# Patient Record
Sex: Female | Born: 1975 | Race: Black or African American | Hispanic: No | Marital: Married | Smoking: Never smoker
Health system: Southern US, Community
[De-identification: ages and names within clinical notes are randomized; demographics above are authoritative.]

## PROBLEM LIST (undated history)

## (undated) DIAGNOSIS — Z8719 Personal history of other diseases of the digestive system: Secondary | ICD-10-CM

## (undated) DIAGNOSIS — D649 Anemia, unspecified: Secondary | ICD-10-CM

## (undated) DIAGNOSIS — G4730 Sleep apnea, unspecified: Secondary | ICD-10-CM

## (undated) DIAGNOSIS — J45909 Unspecified asthma, uncomplicated: Secondary | ICD-10-CM

## (undated) DIAGNOSIS — K219 Gastro-esophageal reflux disease without esophagitis: Secondary | ICD-10-CM

## (undated) DIAGNOSIS — I10 Essential (primary) hypertension: Secondary | ICD-10-CM

## (undated) DIAGNOSIS — D219 Benign neoplasm of connective and other soft tissue, unspecified: Secondary | ICD-10-CM

## (undated) DIAGNOSIS — L709 Acne, unspecified: Secondary | ICD-10-CM

## (undated) HISTORY — DX: Benign neoplasm of connective and other soft tissue, unspecified: D21.9

## (undated) HISTORY — DX: Essential (primary) hypertension: I10

## (undated) HISTORY — PX: WISDOM TOOTH EXTRACTION: SHX21

## (undated) HISTORY — DX: Acne, unspecified: L70.9

## (undated) HISTORY — DX: Morbid (severe) obesity due to excess calories: E66.01

---

## 2003-10-21 ENCOUNTER — Other Ambulatory Visit: Admission: RE | Admit: 2003-10-21 | Discharge: 2003-10-21 | Payer: Self-pay | Admitting: Obstetrics and Gynecology

## 2004-01-29 ENCOUNTER — Encounter: Admission: RE | Admit: 2004-01-29 | Discharge: 2004-01-29 | Payer: Self-pay | Admitting: Family Medicine

## 2004-10-27 ENCOUNTER — Other Ambulatory Visit: Admission: RE | Admit: 2004-10-27 | Discharge: 2004-10-27 | Payer: Self-pay | Admitting: Obstetrics and Gynecology

## 2006-04-27 ENCOUNTER — Other Ambulatory Visit: Admission: RE | Admit: 2006-04-27 | Discharge: 2006-04-27 | Payer: Self-pay | Admitting: Obstetrics and Gynecology

## 2007-11-06 ENCOUNTER — Emergency Department (HOSPITAL_COMMUNITY): Admission: EM | Admit: 2007-11-06 | Discharge: 2007-11-06 | Payer: Self-pay | Admitting: Emergency Medicine

## 2008-01-13 ENCOUNTER — Emergency Department (HOSPITAL_COMMUNITY): Admission: EM | Admit: 2008-01-13 | Discharge: 2008-01-14 | Payer: Self-pay | Admitting: Emergency Medicine

## 2010-10-03 ENCOUNTER — Ambulatory Visit (HOSPITAL_BASED_OUTPATIENT_CLINIC_OR_DEPARTMENT_OTHER)
Admission: RE | Admit: 2010-10-03 | Discharge: 2010-10-03 | Disposition: A | Discharge status: Home / Self Care | Payer: BC Managed Care – PPO | Source: Ambulatory Visit | Attending: Emergency Medicine | Admitting: Emergency Medicine

## 2010-10-03 ENCOUNTER — Other Ambulatory Visit (HOSPITAL_BASED_OUTPATIENT_CLINIC_OR_DEPARTMENT_OTHER): Payer: Self-pay | Admitting: Emergency Medicine

## 2010-10-03 ENCOUNTER — Encounter (HOSPITAL_BASED_OUTPATIENT_CLINIC_OR_DEPARTMENT_OTHER): Payer: Self-pay

## 2010-10-03 DIAGNOSIS — R52 Pain, unspecified: Secondary | ICD-10-CM

## 2010-10-03 DIAGNOSIS — M79609 Pain in unspecified limb: Secondary | ICD-10-CM | POA: Insufficient documentation

## 2013-10-02 ENCOUNTER — Ambulatory Visit: Payer: Self-pay | Admitting: Specialist

## 2013-10-02 LAB — PROTIME-INR
INR: 1
PROTHROMBIN TIME: 12.9 s (ref 11.5–14.7)

## 2013-10-02 LAB — COMPREHENSIVE METABOLIC PANEL
ALK PHOS: 77 U/L
ALT: 23 U/L (ref 12–78)
ANION GAP: 3 — AB (ref 7–16)
Albumin: 3.3 g/dL — ABNORMAL LOW (ref 3.4–5.0)
BILIRUBIN TOTAL: 0.4 mg/dL (ref 0.2–1.0)
BUN: 11 mg/dL (ref 7–18)
CO2: 26 mmol/L (ref 21–32)
CREATININE: 0.81 mg/dL (ref 0.60–1.30)
Calcium, Total: 9 mg/dL (ref 8.5–10.1)
Chloride: 105 mmol/L (ref 98–107)
EGFR (Non-African Amer.): >60
GLUCOSE: 84 mg/dL (ref 65–99)
OSMOLALITY: 267 (ref 275–301)
Potassium: 3.7 mmol/L (ref 3.5–5.1)
SGOT(AST): 20 U/L (ref 15–37)
Sodium: 134 mmol/L — ABNORMAL LOW (ref 136–145)
TOTAL PROTEIN: 8 g/dL (ref 6.4–8.2)

## 2013-10-02 LAB — CBC WITH DIFFERENTIAL/PLATELET
Basophil #: 0.1 10*3/uL (ref 0.0–0.1)
Basophil %: 1.1 %
EOS ABS: 0.2 10*3/uL (ref 0.0–0.7)
EOS PCT: 3.9 %
HCT: 38.4 % (ref 35.0–47.0)
HGB: 12.7 g/dL (ref 12.0–16.0)
LYMPHS ABS: 1.9 10*3/uL (ref 1.0–3.6)
LYMPHS PCT: 32.4 %
MCH: 28.4 pg (ref 26.0–34.0)
MCHC: 33 g/dL (ref 32.0–36.0)
MCV: 86 fL (ref 80–100)
MONOS PCT: 6.6 %
Monocyte #: 0.4 x10 3/mm (ref 0.2–0.9)
NEUTROS PCT: 56 %
Neutrophil #: 3.3 10*3/uL (ref 1.4–6.5)
PLATELETS: 219 10*3/uL (ref 150–440)
RBC: 4.45 10*6/uL (ref 3.80–5.20)
RDW: 14.5 % (ref 11.5–14.5)
WBC: 5.8 10*3/uL (ref 3.6–11.0)

## 2013-10-02 LAB — FOLATE: FOLIC ACID: 15.9 ng/mL (ref 3.1–100.0)

## 2013-10-02 LAB — IRON AND TIBC
Iron Bind.Cap.(Total): 425 ug/dL (ref 250–450)
Iron Saturation: 15 %
Iron: 62 ug/dL (ref 50–170)
UNBOUND IRON-BIND. CAP.: 363 ug/dL

## 2013-10-02 LAB — TSH: Thyroid Stimulating Horm: 1.54 u[IU]/mL

## 2013-10-02 LAB — MAGNESIUM: Magnesium: 1.7 mg/dL — ABNORMAL LOW

## 2013-10-02 LAB — AMYLASE: Amylase: 73 U/L (ref 25–115)

## 2013-10-02 LAB — HEMOGLOBIN A1C: HEMOGLOBIN A1C: 5.8 % (ref 4.2–6.3)

## 2013-10-02 LAB — FERRITIN: Ferritin (ARMC): 7 ng/mL — ABNORMAL LOW (ref 8–388)

## 2013-10-02 LAB — APTT: ACTIVATED PTT: 24.7 s (ref 23.6–35.9)

## 2013-10-02 LAB — BILIRUBIN, DIRECT: Bilirubin, Direct: 0.1 mg/dL (ref 0.00–0.20)

## 2013-10-02 LAB — PHOSPHORUS: PHOSPHORUS: 2.4 mg/dL — AB (ref 2.5–4.9)

## 2013-10-02 LAB — LIPASE, BLOOD: LIPASE: 66 U/L — AB (ref 73–393)

## 2013-10-07 ENCOUNTER — Other Ambulatory Visit (HOSPITAL_COMMUNITY): Payer: Self-pay | Admitting: Family Medicine

## 2013-10-07 DIAGNOSIS — R0602 Shortness of breath: Secondary | ICD-10-CM

## 2013-10-10 ENCOUNTER — Encounter (HOSPITAL_COMMUNITY): Payer: BC Managed Care – PPO

## 2013-10-16 ENCOUNTER — Ambulatory Visit: Payer: Self-pay | Admitting: Specialist

## 2013-10-24 ENCOUNTER — Ambulatory Visit: Payer: Self-pay | Admitting: Specialist

## 2013-10-26 ENCOUNTER — Ambulatory Visit: Payer: Self-pay | Admitting: Specialist

## 2014-08-18 ENCOUNTER — Ambulatory Visit: Payer: Self-pay | Admitting: Specialist

## 2014-08-26 ENCOUNTER — Ambulatory Visit
Admission: RE | Admit: 2014-08-26 | Discharge: 2014-08-26 | Disposition: A | Discharge status: Home / Self Care | Payer: BC Managed Care – PPO | Source: Ambulatory Visit | Attending: Physician Assistant | Admitting: Physician Assistant

## 2014-08-26 ENCOUNTER — Other Ambulatory Visit: Payer: Self-pay | Admitting: Physician Assistant

## 2014-08-26 DIAGNOSIS — R059 Cough, unspecified: Secondary | ICD-10-CM

## 2014-08-26 DIAGNOSIS — R05 Cough: Secondary | ICD-10-CM

## 2015-02-26 ENCOUNTER — Other Ambulatory Visit: Payer: Self-pay

## 2015-02-26 ENCOUNTER — Encounter (HOSPITAL_COMMUNITY): Payer: Self-pay

## 2015-02-26 ENCOUNTER — Encounter (HOSPITAL_COMMUNITY)
Admission: RE | Admit: 2015-02-26 | Discharge: 2015-02-26 | Disposition: A | Discharge status: Home / Self Care | Payer: BLUE CROSS/BLUE SHIELD | Source: Ambulatory Visit | Attending: Obstetrics and Gynecology | Admitting: Obstetrics and Gynecology

## 2015-02-26 DIAGNOSIS — Z01818 Encounter for other preprocedural examination: Secondary | ICD-10-CM | POA: Insufficient documentation

## 2015-02-26 HISTORY — DX: Anemia, unspecified: D64.9

## 2015-02-26 HISTORY — DX: Sleep apnea, unspecified: G47.30

## 2015-02-26 HISTORY — DX: Unspecified asthma, uncomplicated: J45.909

## 2015-02-26 HISTORY — DX: Gastro-esophageal reflux disease without esophagitis: K21.9

## 2015-02-26 HISTORY — DX: Personal history of other diseases of the digestive system: Z87.19

## 2015-02-26 LAB — BASIC METABOLIC PANEL
ANION GAP: 6 (ref 5–15)
BUN: 14 mg/dL (ref 6–20)
CALCIUM: 9.1 mg/dL (ref 8.9–10.3)
CO2: 26 mmol/L (ref 22–32)
CREATININE: 0.89 mg/dL (ref 0.44–1.00)
Chloride: 103 mmol/L (ref 101–111)
GFR calc Af Amer: >60 mL/min (ref >60)
Glucose, Bld: 118 mg/dL — ABNORMAL HIGH (ref 65–99)
POTASSIUM: 3.2 mmol/L — AB (ref 3.5–5.1)
SODIUM: 135 mmol/L (ref 135–145)

## 2015-02-26 LAB — CBC
HCT: 36.8 % (ref 36.0–46.0)
Hemoglobin: 12.1 g/dL (ref 12.0–15.0)
MCH: 27.6 pg (ref 26.0–34.0)
MCHC: 32.9 g/dL (ref 30.0–36.0)
MCV: 83.8 fL (ref 78.0–100.0)
Platelets: 237 10*3/uL (ref 150–400)
RBC: 4.39 MIL/uL (ref 3.87–5.11)
RDW: 15.1 % (ref 11.5–15.5)
WBC: 5.9 10*3/uL (ref 4.0–10.5)

## 2015-02-26 NOTE — Patient Instructions (Signed)
Your procedure is scheduled on:03/03/15  Enter through the Main Entrance at :6am Pick up desk phone and dial [phone] and inform us of your arrival.  Please call [phone] if you have any problems the morning of surgery.  Remember: Do not eat food or drink liquids, including water, after midnight:Tuesday    You may brush your teeth the morning of surgery.  Take these meds the morning of surgery with a sip of water:Blood pressure pills  DO NOT wear jewelry, eye make-up, lipstick,body lotion, or dark fingernail polish.  (Polished toes are ok) You may wear deodorant.  If you are to be admitted after surgery, leave suitcase in car until your room has been assigned. Patients discharged on the day of surgery will not be allowed to drive home. Wear loose fitting, comfortable clothes for your ride home.

## 2015-03-02 MED ORDER — DEXTROSE 5 % IV SOLN
3.0000 g | INTRAVENOUS | Status: AC
  Administered 2015-03-03: 3 g via INTRAVENOUS
  Filled 2015-03-02: qty 3000

## 2015-03-02 NOTE — Anesthesia Preprocedure Evaluation (Addendum)
Anesthesia Evaluation  Patient identified by MRN, date of birth, ID band Patient awake    Reviewed: Allergy & Precautions, NPO status , Patient's Chart, lab work & pertinent test results  History of Anesthesia Complications Negative for: history of anesthetic complications  Airway Mallampati: I  TM Distance: >3 FB Neck ROM: Full    Dental no notable dental hx. (+) Dental Advisory Given, Chipped,    Pulmonary asthma , sleep apnea and Continuous Positive Airway Pressure Ventilation ,  breath sounds clear to auscultation  Pulmonary exam normal       Cardiovascular hypertension, Pt. on medications Normal cardiovascular examRhythm:Regular Rate:Normal     Neuro/Psych negative neurological ROS  negative psych ROS   GI/Hepatic Neg liver ROS, hiatal hernia, GERD-  Medicated and Controlled,  Endo/Other  Morbid obesity  Renal/GU negative Renal ROS  negative genitourinary   Musculoskeletal negative musculoskeletal ROS (+)   Abdominal (+) + obese,   Peds negative pediatric ROS (+)  Hematology  (+) anemia ,   Anesthesia Other Findings   Reproductive/Obstetrics Symptomatic myomas                            Anesthesia Physical Anesthesia Plan  ASA: III  Anesthesia Plan: General   Post-op Pain Management:    Induction: Intravenous  Airway Management Planned: Oral ETT  Additional Equipment:   Intra-op Plan:   Post-operative Plan: Extubation in OR  Informed Consent: I have reviewed the patients History and Physical, chart, labs and discussed the procedure including the risks, benefits and alternatives for the proposed anesthesia with the patient or authorized representative who has indicated his/her understanding and acceptance.   Dental advisory given  Plan Discussed with: CRNA, Anesthesiologist and Surgeon  Anesthesia Plan Comments:        Anesthesia Quick Evaluation

## 2015-03-02 NOTE — H&P (Signed)
Sydney Hamilton is an 39 y.o. female. She has known fibroids, symptoms had been well controlled with OCP.  She was also holding out hope of getting pregnant.  However, she has begun to have more irregular bleeding despite OCP and more pelvic/abdominal bloating, and she now wishes to undergo definitive surgical therapy.  Pertinent Gynecological History: Last mammogram: normal Date: 09/2014 Last pap: normal Date: 2013 OB History: G1, P0010   Menstrual History: No LMP recorded.    Past Medical History  Diagnosis Date  . Hypertension   . Fibroids     Dr. Willis Modena  . Acne     Dr. Allyson Sabal  . Morbid obesity   . Asthma     allergy induced  . Sleep apnea     uses CPAP  . GERD (gastroesophageal reflux disease)   . Anemia     history of due to fibroids  . History of hiatal hernia     Past Surgical History  Procedure Laterality Date  . Wisdom tooth extraction      No family history on file.  Social History:  reports that she has never smoked. She does not have any smokeless tobacco history on file. She reports that she does not drink alcohol or use illicit drugs.  Allergies:  Allergies  Allergen Reactions  . Lisinopril Swelling    Lips and face swell    No prescriptions prior to admission    Review of Systems  Respiratory: Negative.   Cardiovascular: Negative.   Gastrointestinal: Negative.   Genitourinary: Negative.     There were no vitals taken for this visit. Physical Exam  Constitutional: She appears well-developed and well-nourished.  Neck: Neck supple. No thyromegaly present.  Cardiovascular: Normal rate, regular rhythm and normal heart sounds.   No murmur heard. Respiratory: Effort normal and breath sounds normal. No respiratory distress. She has no wheezes.  GI: Soft. She exhibits no distension and no mass. There is no tenderness.  Genitourinary: Vagina normal.  Uterus 8-10 weeks, irregular No adnexal mass    No results found for this or any previous visit  (from the past 24 hour(s)).  No results found.  Assessment/Plan: Known history of myomas, now becoming symptomatic with irregular bleeding even on OCP.  All medical and surgical options have been discussed, she wishes to proceed with definitive surgical therapy.  Surgical procedure, risks, chances of relieving symptoms have all been discussed.  Will admit for TLH, bilateral salpingectomy and vaginal closure of cuff.  Ishan Sanroman D 03/02/2015, 9:00 PM

## 2015-03-03 ENCOUNTER — Encounter (HOSPITAL_COMMUNITY)
Admission: RE | Disposition: A | Discharge status: Home / Self Care | Payer: Self-pay | Source: Ambulatory Visit | Attending: Obstetrics and Gynecology

## 2015-03-03 ENCOUNTER — Ambulatory Visit (HOSPITAL_COMMUNITY): Payer: BLUE CROSS/BLUE SHIELD | Admitting: Anesthesiology

## 2015-03-03 ENCOUNTER — Ambulatory Visit (HOSPITAL_COMMUNITY)
Admission: RE | Admit: 2015-03-03 | Discharge: 2015-03-04 | Disposition: A | Discharge status: Home / Self Care | Payer: BLUE CROSS/BLUE SHIELD | Source: Ambulatory Visit | Attending: Obstetrics and Gynecology | Admitting: Obstetrics and Gynecology

## 2015-03-03 ENCOUNTER — Encounter (HOSPITAL_COMMUNITY): Payer: Self-pay | Admitting: *Deleted

## 2015-03-03 DIAGNOSIS — I10 Essential (primary) hypertension: Secondary | ICD-10-CM | POA: Insufficient documentation

## 2015-03-03 DIAGNOSIS — D259 Leiomyoma of uterus, unspecified: Secondary | ICD-10-CM | POA: Diagnosis present

## 2015-03-03 DIAGNOSIS — G4730 Sleep apnea, unspecified: Secondary | ICD-10-CM | POA: Insufficient documentation

## 2015-03-03 DIAGNOSIS — J45909 Unspecified asthma, uncomplicated: Secondary | ICD-10-CM | POA: Diagnosis not present

## 2015-03-03 DIAGNOSIS — K219 Gastro-esophageal reflux disease without esophagitis: Secondary | ICD-10-CM | POA: Diagnosis not present

## 2015-03-03 DIAGNOSIS — Z888 Allergy status to other drugs, medicaments and biological substances status: Secondary | ICD-10-CM | POA: Insufficient documentation

## 2015-03-03 DIAGNOSIS — D649 Anemia, unspecified: Secondary | ICD-10-CM | POA: Insufficient documentation

## 2015-03-03 DIAGNOSIS — Z90721 Acquired absence of ovaries, unilateral: Secondary | ICD-10-CM

## 2015-03-03 DIAGNOSIS — N838 Other noninflammatory disorders of ovary, fallopian tube and broad ligament: Secondary | ICD-10-CM | POA: Diagnosis not present

## 2015-03-03 DIAGNOSIS — Z90710 Acquired absence of both cervix and uterus: Secondary | ICD-10-CM | POA: Diagnosis present

## 2015-03-03 DIAGNOSIS — K449 Diaphragmatic hernia without obstruction or gangrene: Secondary | ICD-10-CM | POA: Insufficient documentation

## 2015-03-03 HISTORY — PX: REPAIR VAGINAL CUFF: SHX6067

## 2015-03-03 HISTORY — PX: LAPAROSCOPIC HYSTERECTOMY: SHX1926

## 2015-03-03 LAB — GLUCOSE, CAPILLARY: GLUCOSE-CAPILLARY: 140 mg/dL — AB (ref 65–99)

## 2015-03-03 LAB — PREGNANCY, URINE: PREG TEST UR: NEGATIVE

## 2015-03-03 SURGERY — HYSTERECTOMY, TOTAL, LAPAROSCOPIC
Anesthesia: General | Site: Vagina

## 2015-03-03 MED ORDER — FENTANYL CITRATE (PF) 100 MCG/2ML IJ SOLN
INTRAMUSCULAR | Status: AC
  Administered 2015-03-03: 25 ug via INTRAVENOUS
  Filled 2015-03-03: qty 2

## 2015-03-03 MED ORDER — GLYCOPYRROLATE 0.2 MG/ML IJ SOLN
INTRAMUSCULAR | Status: DC | PRN
  Administered 2015-03-03: 0.6 mg via INTRAVENOUS

## 2015-03-03 MED ORDER — MENTHOL 3 MG MT LOZG
1.0000 | LOZENGE | OROMUCOSAL | Status: DC | PRN
  Filled 2015-03-03: qty 9

## 2015-03-03 MED ORDER — SODIUM CHLORIDE 0.9 % IJ SOLN
INTRAMUSCULAR | Status: AC
  Filled 2015-03-03: qty 10

## 2015-03-03 MED ORDER — MIDAZOLAM HCL 2 MG/2ML IJ SOLN
INTRAMUSCULAR | Status: DC | PRN
  Administered 2015-03-03: 2 mg via INTRAVENOUS

## 2015-03-03 MED ORDER — KETOROLAC TROMETHAMINE 30 MG/ML IJ SOLN
INTRAMUSCULAR | Status: DC | PRN
  Administered 2015-03-03: 30 mg via INTRAVENOUS

## 2015-03-03 MED ORDER — IBUPROFEN 600 MG PO TABS
600.0000 mg | ORAL_TABLET | Freq: Four times a day (QID) | ORAL | Status: DC | PRN
  Administered 2015-03-04: 600 mg via ORAL
  Filled 2015-03-03: qty 1

## 2015-03-03 MED ORDER — PROPOFOL 10 MG/ML IV BOLUS
INTRAVENOUS | Status: DC | PRN
  Administered 2015-03-03: 40 mg via INTRAVENOUS
  Administered 2015-03-03: 160 mg via INTRAVENOUS

## 2015-03-03 MED ORDER — ONDANSETRON HCL 4 MG/2ML IJ SOLN: 4.0000 mg | Freq: Once | INTRAMUSCULAR | Status: DC | PRN

## 2015-03-03 MED ORDER — FENTANYL CITRATE (PF) 100 MCG/2ML IJ SOLN
25.0000 ug | INTRAMUSCULAR | Status: DC | PRN
  Administered 2015-03-03: 25 ug via INTRAVENOUS

## 2015-03-03 MED ORDER — BUPIVACAINE HCL (PF) 0.25 % IJ SOLN
INTRAMUSCULAR | Status: AC
Start: 2015-03-03 — End: 2015-03-03
  Filled 2015-03-03: qty 30

## 2015-03-03 MED ORDER — PNEUMOCOCCAL VAC POLYVALENT 25 MCG/0.5ML IJ INJ
0.5000 mL | INJECTION | INTRAMUSCULAR | Status: DC
  Filled 2015-03-03: qty 0.5

## 2015-03-03 MED ORDER — LACTATED RINGERS IR SOLN
Status: DC | PRN
  Administered 2015-03-03: 3000 mL

## 2015-03-03 MED ORDER — HYDROCHLOROTHIAZIDE 25 MG PO TABS: 25.0000 mg | ORAL_TABLET | Freq: Every day | ORAL | Status: DC

## 2015-03-03 MED ORDER — DEXAMETHASONE SODIUM PHOSPHATE 4 MG/ML IJ SOLN
INTRAMUSCULAR | Status: DC | PRN
  Administered 2015-03-03: 4 mg via INTRAVENOUS

## 2015-03-03 MED ORDER — AMLODIPINE BESYLATE 5 MG PO TABS
5.0000 mg | ORAL_TABLET | Freq: Every day | ORAL | Status: DC
  Filled 2015-03-03: qty 1

## 2015-03-03 MED ORDER — HYDROMORPHONE HCL 1 MG/ML IJ SOLN: 1.0000 mg | INTRAMUSCULAR | Status: DC | PRN

## 2015-03-03 MED ORDER — MIDAZOLAM HCL 2 MG/2ML IJ SOLN
INTRAMUSCULAR | Status: AC
  Filled 2015-03-03: qty 2

## 2015-03-03 MED ORDER — OXYCODONE-ACETAMINOPHEN 5-325 MG PO TABS
1.0000 | ORAL_TABLET | ORAL | Status: DC | PRN
  Administered 2015-03-03 – 2015-03-04 (×3): 2 via ORAL
  Filled 2015-03-03 (×3): qty 2

## 2015-03-03 MED ORDER — FENTANYL CITRATE (PF) 100 MCG/2ML IJ SOLN
INTRAMUSCULAR | Status: AC
  Filled 2015-03-03: qty 2

## 2015-03-03 MED ORDER — ALUM & MAG HYDROXIDE-SIMETH 200-200-20 MG/5ML PO SUSP: 30.0000 mL | ORAL | Status: DC | PRN

## 2015-03-03 MED ORDER — LIDOCAINE HCL (CARDIAC) 20 MG/ML IV SOLN
INTRAVENOUS | Status: AC
  Filled 2015-03-03: qty 5

## 2015-03-03 MED ORDER — HYDROMORPHONE HCL 1 MG/ML IJ SOLN
INTRAMUSCULAR | Status: DC | PRN
  Administered 2015-03-03 (×2): 0.5 mg via INTRAVENOUS

## 2015-03-03 MED ORDER — FENTANYL CITRATE (PF) 100 MCG/2ML IJ SOLN
INTRAMUSCULAR | Status: DC | PRN
  Administered 2015-03-03: 50 ug via INTRAVENOUS
  Administered 2015-03-03 (×2): 100 ug via INTRAVENOUS
  Administered 2015-03-03 (×2): 50 ug via INTRAVENOUS

## 2015-03-03 MED ORDER — KETOROLAC TROMETHAMINE 30 MG/ML IJ SOLN: 30.0000 mg | Freq: Once | INTRAMUSCULAR | Status: DC

## 2015-03-03 MED ORDER — ROCURONIUM BROMIDE 100 MG/10ML IV SOLN
INTRAVENOUS | Status: DC | PRN
  Administered 2015-03-03: 20 mg via INTRAVENOUS
  Administered 2015-03-03 (×2): 10 mg via INTRAVENOUS
  Administered 2015-03-03: 50 mg via INTRAVENOUS

## 2015-03-03 MED ORDER — LACTATED RINGERS IV SOLN
INTRAVENOUS | Status: DC
  Administered 2015-03-03 (×4): via INTRAVENOUS

## 2015-03-03 MED ORDER — DEXAMETHASONE SODIUM PHOSPHATE 4 MG/ML IJ SOLN
INTRAMUSCULAR | Status: AC
  Filled 2015-03-03: qty 1

## 2015-03-03 MED ORDER — ROCURONIUM BROMIDE 100 MG/10ML IV SOLN
INTRAVENOUS | Status: AC
  Filled 2015-03-03: qty 1

## 2015-03-03 MED ORDER — NEOSTIGMINE METHYLSULFATE 10 MG/10ML IV SOLN
INTRAVENOUS | Status: AC
  Filled 2015-03-03: qty 1

## 2015-03-03 MED ORDER — SCOPOLAMINE 1 MG/3DAYS TD PT72
MEDICATED_PATCH | TRANSDERMAL | Status: AC
  Administered 2015-03-03: 1.5 mg via TRANSDERMAL
  Filled 2015-03-03: qty 1

## 2015-03-03 MED ORDER — DEXTROSE-NACL 5-0.45 % IV SOLN
INTRAVENOUS | Status: DC
  Administered 2015-03-03 – 2015-03-04 (×2): via INTRAVENOUS

## 2015-03-03 MED ORDER — GLYCOPYRROLATE 0.2 MG/ML IJ SOLN
INTRAMUSCULAR | Status: AC
  Filled 2015-03-03: qty 3

## 2015-03-03 MED ORDER — PROPOFOL 10 MG/ML IV BOLUS
INTRAVENOUS | Status: AC
  Filled 2015-03-03: qty 20

## 2015-03-03 MED ORDER — SIMETHICONE 80 MG PO CHEW: 80.0000 mg | CHEWABLE_TABLET | Freq: Four times a day (QID) | ORAL | Status: DC | PRN

## 2015-03-03 MED ORDER — ALBUTEROL SULFATE (2.5 MG/3ML) 0.083% IN NEBU: 3.0000 mL | INHALATION_SOLUTION | Freq: Four times a day (QID) | RESPIRATORY_TRACT | Status: DC | PRN

## 2015-03-03 MED ORDER — BUPIVACAINE HCL (PF) 0.25 % IJ SOLN
INTRAMUSCULAR | Status: AC
  Filled 2015-03-03: qty 30

## 2015-03-03 MED ORDER — NEOSTIGMINE METHYLSULFATE 10 MG/10ML IV SOLN
INTRAVENOUS | Status: DC | PRN
  Administered 2015-03-03: 4 mg via INTRAVENOUS

## 2015-03-03 MED ORDER — LIDOCAINE HCL (CARDIAC) 20 MG/ML IV SOLN
INTRAVENOUS | Status: DC | PRN
  Administered 2015-03-03: 80 mg via INTRAVENOUS

## 2015-03-03 MED ORDER — HYDROMORPHONE HCL 1 MG/ML IJ SOLN
INTRAMUSCULAR | Status: AC
  Filled 2015-03-03: qty 1

## 2015-03-03 MED ORDER — BUPIVACAINE HCL (PF) 0.25 % IJ SOLN
INTRAMUSCULAR | Status: DC | PRN
  Administered 2015-03-03 (×2): 4 mL

## 2015-03-03 MED ORDER — KETOROLAC TROMETHAMINE 30 MG/ML IJ SOLN
INTRAMUSCULAR | Status: AC
  Filled 2015-03-03: qty 1

## 2015-03-03 MED ORDER — ONDANSETRON HCL 4 MG/2ML IJ SOLN: 4.0000 mg | Freq: Four times a day (QID) | INTRAMUSCULAR | Status: DC | PRN

## 2015-03-03 MED ORDER — FENTANYL CITRATE (PF) 250 MCG/5ML IJ SOLN
INTRAMUSCULAR | Status: AC
  Filled 2015-03-03: qty 5

## 2015-03-03 MED ORDER — ONDANSETRON HCL 4 MG/2ML IJ SOLN
INTRAMUSCULAR | Status: AC
  Filled 2015-03-03: qty 2

## 2015-03-03 MED ORDER — POTASSIUM CHLORIDE CRYS ER 20 MEQ PO TBCR
20.0000 meq | EXTENDED_RELEASE_TABLET | Freq: Two times a day (BID) | ORAL | Status: DC
  Administered 2015-03-03 – 2015-03-04 (×2): 20 meq via ORAL
  Filled 2015-03-03 (×3): qty 1

## 2015-03-03 MED ORDER — SCOPOLAMINE 1 MG/3DAYS TD PT72
1.0000 | MEDICATED_PATCH | Freq: Once | TRANSDERMAL | Status: DC
  Administered 2015-03-03: 1.5 mg via TRANSDERMAL

## 2015-03-03 MED ORDER — ACETAMINOPHEN 10 MG/ML IV SOLN
1000.0000 mg | Freq: Once | INTRAVENOUS | Status: AC
  Administered 2015-03-03: 1000 mg via INTRAVENOUS
  Filled 2015-03-03: qty 100

## 2015-03-03 MED ORDER — KETOROLAC TROMETHAMINE 30 MG/ML IJ SOLN: 30.0000 mg | Freq: Four times a day (QID) | INTRAMUSCULAR | Status: DC

## 2015-03-03 MED ORDER — KETOROLAC TROMETHAMINE 30 MG/ML IJ SOLN
30.0000 mg | Freq: Four times a day (QID) | INTRAMUSCULAR | Status: DC
  Administered 2015-03-03 – 2015-03-04 (×2): 30 mg via INTRAVENOUS
  Filled 2015-03-03 (×2): qty 1

## 2015-03-03 MED ORDER — SODIUM CHLORIDE 0.9 % IJ SOLN
INTRAMUSCULAR | Status: DC | PRN
  Administered 2015-03-03: 10 mL

## 2015-03-03 MED ORDER — ONDANSETRON HCL 4 MG PO TABS: 4.0000 mg | ORAL_TABLET | Freq: Four times a day (QID) | ORAL | Status: DC | PRN

## 2015-03-03 SURGICAL SUPPLY — 43 items
BARRIER ADHS 3X4 INTERCEED (GAUZE/BANDAGES/DRESSINGS) IMPLANT
BRR ADH 4X3 ABS CNTRL BYND (GAUZE/BANDAGES/DRESSINGS)
CABLE HIGH FREQUENCY MONO STRZ (ELECTRODE) IMPLANT
CLOTH BEACON ORANGE TIMEOUT ST (SAFETY) ×4 IMPLANT
COVER BACK TABLE 60X90IN (DRAPES) ×4 IMPLANT
COVER LIGHT HANDLE  1/PK (MISCELLANEOUS) ×4
COVER LIGHT HANDLE 1/PK (MISCELLANEOUS) ×4 IMPLANT
COVER MAYO STAND STRL (DRAPES) ×4 IMPLANT
DURAPREP 26ML APPLICATOR (WOUND CARE) ×4 IMPLANT
EVACUATOR SMOKE 8.L (FILTER) ×4 IMPLANT
GLOVE BIO SURGEON STRL SZ8 (GLOVE) ×4 IMPLANT
GLOVE BIOGEL PI IND STRL 7.0 (GLOVE) ×4 IMPLANT
GLOVE BIOGEL PI IND STRL 8 (GLOVE) ×2 IMPLANT
GLOVE BIOGEL PI INDICATOR 7.0 (GLOVE) ×4
GLOVE BIOGEL PI INDICATOR 8 (GLOVE) ×2
GLOVE ORTHO TXT STRL SZ7.5 (GLOVE) ×4 IMPLANT
LIQUID BAND (GAUZE/BANDAGES/DRESSINGS) ×4 IMPLANT
NS IRRIG 1000ML POUR BTL (IV SOLUTION) ×4 IMPLANT
OCCLUDER COLPOPNEUMO (BALLOONS) ×4 IMPLANT
PACK LAVH (CUSTOM PROCEDURE TRAY) ×4 IMPLANT
PACK ROBOTIC GOWN (GOWN DISPOSABLE) ×8 IMPLANT
PAD POSITIONER PINK NONSTERILE (MISCELLANEOUS) ×4 IMPLANT
SCISSORS LAP 5X35 DISP (ENDOMECHANICALS) IMPLANT
SET CYSTO W/LG BORE CLAMP LF (SET/KITS/TRAYS/PACK) IMPLANT
SET IRRIG TUBING LAPAROSCOPIC (IRRIGATION / IRRIGATOR) ×4 IMPLANT
SHEARS HARMONIC ACE PLUS 36CM (ENDOMECHANICALS) ×3 IMPLANT
SLEEVE XCEL OPT CAN 5 100 (ENDOMECHANICALS) ×4 IMPLANT
SUT VIC AB 0 CT1 36 (SUTURE) ×6 IMPLANT
SUT VIC AB 3-0 PS2 18 (SUTURE) ×4
SUT VIC AB 3-0 PS2 18XBRD (SUTURE) ×2 IMPLANT
SUT VICRYL 0 UR6 27IN ABS (SUTURE) ×4 IMPLANT
SYR TOOMEY 50ML (SYRINGE) ×4 IMPLANT
SYRINGE 10CC LL (SYRINGE) ×4 IMPLANT
TIP UTERINE 5.1X6CM LAV DISP (MISCELLANEOUS) IMPLANT
TIP UTERINE 6.7X10CM GRN DISP (MISCELLANEOUS) IMPLANT
TIP UTERINE 6.7X6CM WHT DISP (MISCELLANEOUS) IMPLANT
TIP UTERINE 6.7X8CM BLUE DISP (MISCELLANEOUS) ×4 IMPLANT
TOWEL OR 17X24 6PK STRL BLUE (TOWEL DISPOSABLE) ×8 IMPLANT
TRAY FOLEY CATH SILVER 14FR (SET/KITS/TRAYS/PACK) ×4 IMPLANT
TROCAR XCEL DIL TIP R 11M (ENDOMECHANICALS) ×4 IMPLANT
TROCAR XCEL NON-BLD 5MMX100MML (ENDOMECHANICALS) ×7 IMPLANT
WARMER LAPAROSCOPE (MISCELLANEOUS) ×4 IMPLANT
WATER STERILE IRR 1000ML POUR (IV SOLUTION) ×4 IMPLANT

## 2015-03-03 NOTE — Addendum Note (Signed)
Addendum  created 03/03/15 1329 by Raenette Rover, CRNA   Modules edited: Notes Section   Notes Section:  File: [id number]

## 2015-03-03 NOTE — Transfer of Care (Signed)
Immediate Anesthesia Transfer of Care Note  Patient: Sydney Hamilton  Procedure(s) Performed: Procedure(s): HYSTERECTOMY TOTAL LAPAROSCOPIC (N/A) REPAIR VAGINAL CUFF (N/A)  Patient Location: PACU  Anesthesia Type:General  Level of Consciousness: awake, alert  and oriented  Airway & Oxygen Therapy: Patient Spontanous Breathing and Patient connected to nasal cannula oxygen  Post-op Assessment: Report given to RN and Post -op Vital signs reviewed and stable  Post vital signs: Reviewed and stable  Last Vitals:  Filed Vitals:   03/03/15 0626  BP: 141/97  Pulse: 88  Temp: 37.2 C  Resp: 18    Complications: No apparent anesthesia complications

## 2015-03-03 NOTE — Anesthesia Postprocedure Evaluation (Signed)
  Anesthesia Post-op Note  Patient: Sydney Hamilton  Procedure(s) Performed: Procedure(s): HYSTERECTOMY TOTAL LAPAROSCOPIC (N/A) REPAIR VAGINAL CUFF (N/A)  Patient Location: [facility] Unit  Anesthesia Type:General  Level of Consciousness: awake, alert , oriented and patient cooperative  Airway and Oxygen Therapy: Patient Spontanous Breathing and Patient connected to nasal cannula oxygen  Post-op Pain: none  Post-op Assessment: Post-op Vital signs reviewed, Patient's Cardiovascular Status Stable, Respiratory Function Stable, Patent Airway and No signs of Nausea or vomiting              Post-op Vital Signs: Reviewed and stable  Last Vitals:  Filed Vitals:   03/03/15 1226  BP: 126/76  Pulse: 82  Temp:   Resp: 20    Complications: No apparent anesthesia complications

## 2015-03-03 NOTE — Op Note (Signed)
Preoperative diagnosis: Symptomatic myomatous uterus Postoperative diagnosis: Same  Procedure: Total laparoscopic hysterectomy, bilateral salpingectomy  Surgeon: Cheri Fowler M.D.  Assistant: Paula Compton M.D.  Anesthesia: Gen. Endotracheal tube  Findings: She had an essentially normal pelvis with significantly enlarged and irregular uterus, normal tubes and ovaries  Specimens: Uterus and tubes for routine pathology  Estimated blood loss: 132 cc  Complications: None  Procedure in detail:  The patient was taken to the operating room and placed in the dorsosupine position. General anesthesia was induced. Arms were tucked to her sides and legs were placed in mobile stirrups. Abdomen perineum and vagina were then prepped and draped in usual sterile fashion. An 67mm RUMI was then applied to the uterus and cervix with the small metal cup for uterine manipulation and a Foley catheter was inserted. Infraumbilical skin was infiltrated with quarter percent Marcaine and a 1 cm vertical incision was made. A veress needle was inserted into the peritoneal cavity and placement confirmed by the water drop test and an opening pressure of 7 mm of mercury. CO2 was insufflated to a pressure of 13 mm mercury and a veress needle was removed. A 5 mm trocar was then introduced with direct visualization with the laparoscope. A 5 mm port was then also placed on each side under direct visualization. Inspection revealed the above-mentioned findings. The distal right fallopian tube was grasped with a grasper from the left side. The Harmonic scalpel Ace was used to take down the right mesosalpinx, followed by the round ligament, utero-ovarian pedicle and broad ligament. The anterior peritoneum was incised across the anterior portion of the uterus to help release the bladder. Uterine artery artery was not well identified initially. A similar procedure was then performed on the patient's left side taking down the mesosalpinx, round  ligament, utero-ovarian pedicle, and broad ligament. Anterior peritoneum was incised across the anterior portion the uterus to meet the incision coming from the patient's right side. Uterine artery was skeletonized and taken down with the Harmonic Scalpel with adequate division and adequate hemostasis eventually requiring bipolar cautery. We then went back to the right side and took down the right uterine artery with the Harmonic scalpel with adequate hemostasis.  I then began to detach the cervix from the vagina. The RUMI cup was pushed superiorly, the Harmonic scalpel was placed on the cup edge and a circumferential incision was made starting anteriorly into the vagina. This released the uterosacral ligaments as well as all attachments to the vagina. At this point all pedicles appeared to be hemostatic and there was no other pathology noted.   Attention was turned vaginally. The legs were elevated in the stirrups, the RUMI was removed from the uterus.  The cervix was grasped with Ardis Hughs tenaculums and the uterus brought to the vaginal incision.  It was necessary to morcellate the uterus and remove it in several pieces due to it's size.  Eventually the entire uterus with both tubes was removed. The vaginal cuff was then exposed and closed vertically with running locking 0 Vicryl. This achieved adequate closure and hemostasis.   Attention was turned abdominally. Myself and the scrub tech changed gowns and gloves, Dr. Marvel Plan had remained in the abdominal field. The legs were lowered and the abdomen was reinsufflated. The pelvis was irrigated and all pedicles found to be hemostatic. The lateral 5 mm ports were removed under direct visualization and all gas was allowed to deflate from the abdomen. The umbilical trocar was then removed. Skin incisions were closed with  interrupted subcuticular sutures of 4-0 Vicryl followed by Liquiband. The foley catheter was removed.  The patient tolerated the procedure well.  She was taken to the recovery in stable condition. Counts were correct x2, she received Ancef 3 g IV the beginning of the procedure and had PAS hose on throughout the procedure.

## 2015-03-03 NOTE — Anesthesia Procedure Notes (Signed)
Procedure Name: Intubation Date/Time: 03/03/2015 7:29 AM Performed by: Sheletha Bow, Sheron Nightingale Pre-anesthesia Checklist: Patient identified, Timeout performed, Emergency Drugs available, Suction available and Patient being monitored Patient Re-evaluated:Patient Re-evaluated prior to inductionOxygen Delivery Method: Circle system utilized Preoxygenation: Pre-oxygenation with 100% oxygen Intubation Type: IV induction Ventilation: Mask ventilation without difficulty Laryngoscope Size: Mac and 4 Grade View: Grade I Tube type: Oral Tube size: 7.0 mm Number of attempts: 1 Airway Equipment and Method: Patient positioned with wedge pillow Placement Confirmation: ETT inserted through vocal cords under direct vision,  positive ETCO2,  CO2 detector and breath sounds checked- equal and bilateral Secured at: 21 cm Dental Injury: Teeth and Oropharynx as per pre-operative assessment

## 2015-03-03 NOTE — Anesthesia Postprocedure Evaluation (Signed)
  Anesthesia Post-op Note  Patient: Sydney Hamilton  Procedure(s) Performed: Procedure(s) (LRB): HYSTERECTOMY TOTAL LAPAROSCOPIC (N/A) REPAIR VAGINAL CUFF (N/A)  Patient Location: PACU  Anesthesia Type: General  Level of Consciousness: awake and alert   Airway and Oxygen Therapy: Patient Spontanous Breathing  Post-op Pain: mild  Post-op Assessment: Post-op Vital signs reviewed, Patient's Cardiovascular Status Stable, Respiratory Function Stable, Patent Airway and No signs of Nausea or vomiting  Last Vitals:  Filed Vitals:   03/03/15 1045  BP:   Pulse:   Temp:   Resp: 20    Post-op Vital Signs: stable   Complications: No apparent anesthesia complications

## 2015-03-03 NOTE — Interval H&P Note (Signed)
History and Physical Interval Note:  03/03/2015 7:04 AM  Sydney Hamilton  has presented today for surgery, with the diagnosis of Symptomatic Myomas  The various methods of treatment have been discussed with the patient and family. After consideration of risks, benefits and other options for treatment, the patient has consented to  Procedure(s): HYSTERECTOMY TOTAL LAPAROSCOPIC (N/A) REPAIR VAGINAL CUFF (N/A) as a surgical intervention .  The patient's history has been reviewed, patient examined, no change in status, stable for surgery.  I have reviewed the patient's chart and labs.  Questions were answered to the patient's satisfaction.     Damiano Stamper D

## 2015-03-03 NOTE — Progress Notes (Signed)
Patient ID: Sydney Hamilton, female   DOB: [birth year], 39 y.o.   MRN: [id number] VS stable. Has been able to void. As I entered the room the pt was in the process of vomiting 40 ounces of material. Afterward she felt better Her abdomen is soft and not tender.

## 2015-03-04 ENCOUNTER — Encounter (HOSPITAL_COMMUNITY): Payer: Self-pay | Admitting: Obstetrics and Gynecology

## 2015-03-04 DIAGNOSIS — D259 Leiomyoma of uterus, unspecified: Secondary | ICD-10-CM | POA: Diagnosis not present

## 2015-03-04 LAB — CBC
HCT: 30.1 % — ABNORMAL LOW (ref 36.0–46.0)
HEMOGLOBIN: 9.9 g/dL — AB (ref 12.0–15.0)
MCH: 27.3 pg (ref 26.0–34.0)
MCHC: 32.9 g/dL (ref 30.0–36.0)
MCV: 83.1 fL (ref 78.0–100.0)
Platelets: 194 10*3/uL (ref 150–400)
RBC: 3.62 MIL/uL — ABNORMAL LOW (ref 3.87–5.11)
RDW: 15.4 % (ref 11.5–15.5)
WBC: 9.1 10*3/uL (ref 4.0–10.5)

## 2015-03-04 MED ORDER — OXYCODONE-ACETAMINOPHEN 5-325 MG PO TABS: 1.0000 | ORAL_TABLET | ORAL | Status: DC | PRN

## 2015-03-04 MED ORDER — IBUPROFEN 600 MG PO TABS: 600.0000 mg | ORAL_TABLET | Freq: Four times a day (QID) | ORAL | Status: DC | PRN

## 2015-03-04 NOTE — Progress Notes (Signed)
Pt is discharged in the care of friend, with R.N. Escort. Denies pain or discomfort. Spirits are good Discharge instructions given with good understanding. Questions were asked and answered. Abdominal  lapsites are clean and dry. No equipment needed for home use.  Stable.

## 2015-03-04 NOTE — Progress Notes (Signed)
1 Day Post-Op Procedure(s) (LRB): HYSTERECTOMY TOTAL LAPAROSCOPIC (N/A) REPAIR VAGINAL CUFF (N/A)  Subjective: Patient reports incisional pain, tolerating PO and no problems voiding.    Objective: I have reviewed patient's vital signs, intake and output and labs.  General: alert GI: soft, incisions intact Vaginal Bleeding: minimal  Assessment: s/p Procedure(s): HYSTERECTOMY TOTAL LAPAROSCOPIC (N/A) REPAIR VAGINAL CUFF (N/A): stable and progressing well  Plan: Advance diet Encourage ambulation Discharge home     Sydney Hamilton D 03/04/2015, 8:05 AM

## 2015-03-04 NOTE — Discharge Instructions (Signed)
Routine instructions for hysterectomy °

## 2015-03-04 NOTE — Discharge Summary (Signed)
Physician Discharge Summary  Patient ID: Sydney Hamilton MRN: [id number] DOB/AGE: [birth year] 41 y.o.  Admit date: 03/03/2015 Discharge date: 03/04/2015  Admission Diagnoses:  Symptomatic myomatous uterus  Discharge Diagnoses:  Same Active Problems:   S/P hysterectomy with oophorectomy   Discharged Condition: good  Hospital Course: Underwent TLH with vaginal morcellation and cuff closure, no post-op complications   Discharge Exam: Blood pressure 109/62, pulse 76, temperature 99.1 F (37.3 C), temperature source Oral, resp. rate 24, height 5\' 6"  (1.676 m), weight 131.543 kg (290 lb), SpO2 97 %. General appearance: alert  Disposition: Final discharge disposition not confirmed  Discharge Instructions    Diet - low sodium heart healthy    Complete by:  As directed      Increase activity slowly    Complete by:  As directed      Lifting restrictions    Complete by:  As directed   10 lbs     Sexual Activity Restrictions    Complete by:  As directed   Pelvic rest            Medication List    STOP taking these medications        LO MINASTRIN FE PO      TAKE these medications        albuterol 108 (90 BASE) MCG/ACT inhaler  Commonly known as:  PROVENTIL HFA;VENTOLIN HFA  Inhale 2 puffs into the lungs every 6 (six) hours as needed for wheezing or shortness of breath.     amLODipine 5 MG tablet  Commonly known as:  NORVASC  Take 5 mg by mouth daily. Amlodipine Besylate 5 MG Tablet 1 tablet Once a day     doxycycline 100 MG capsule  Commonly known as:  VIBRAMYCIN  Take 100 mg by mouth daily as needed (for breakouts).     hydrochlorothiazide 25 MG tablet  Commonly known as:  HYDRODIURIL  Take 25 mg by mouth daily. Hydrochlorothiazide 25 MG Tablet 1 tablet Once a day     ibuprofen 200 MG tablet  Commonly known as:  ADVIL,MOTRIN  Take 800 mg by mouth every 6 (six) hours as needed for moderate pain.     ibuprofen 600 MG tablet  Commonly known as:  ADVIL,MOTRIN  Take 1  tablet (600 mg total) by mouth every 6 (six) hours as needed (mild pain).     oxyCODONE-acetaminophen 5-325 MG per tablet  Commonly known as:  PERCOCET/ROXICET  Take 1-2 tablets by mouth every 4 (four) hours as needed for severe pain (moderate to severe pain (when tolerating fluids)).     potassium chloride SA 20 MEQ tablet  Commonly known as:  K-DUR,KLOR-CON  Take 20 mEq by mouth 2 (two) times daily.     valACYclovir 1000 MG tablet  Commonly known as:  VALTREX  Take 500 mg by mouth daily as needed (for outbreaks).           Follow-up Information    Follow up with Dozier Berkovich D, MD. Schedule an appointment as soon as possible for a visit in 2 weeks.   Specialty:  Obstetrics and Gynecology   Contact information:   [address] [place] [address] [phone]       Signed: Clarene Duke 03/04/2015, 8:07 AM

## 2015-03-08 NOTE — Addendum Note (Signed)
Addendum  created 03/08/15 1608 by Lauretta Grill, MD   Modules edited: Clinical Notes   Clinical Notes:  File: [id number]; [place]: [phone]

## 2015-03-08 NOTE — Progress Notes (Signed)
Patient called with concerns for sore mouth/jaw/gums. Reviewed chart and although a grade 1 view was noted, it was also noted to be a difficult airway secondary to small mouth opening. It is possible that an injury occurred during intubation. The patient reports that she first noticed the pain on 7/9 and thinks that it might have been masked by the percocet she was taking for her surgery. She is unable to get a great look inside of her mouth but can't see any obvious abrasions or bleeding or sores. She denies any loose or chipped teeth. Does report some pain in her right side of the mouth and right teeth. She is planning to go to her dentist tomorrow to get this evaluated and will report back to [place] if there is anything concerning to note. She was very pleased with the follow up by anesthesia to her concerns. All questions answered. Will follow up again tomorrow with the patient. -MJudd

## 2015-08-17 ENCOUNTER — Other Ambulatory Visit: Payer: Self-pay | Admitting: Obstetrics and Gynecology

## 2015-08-17 DIAGNOSIS — N6320 Unspecified lump in the left breast, unspecified quadrant: Secondary | ICD-10-CM

## 2015-09-14 ENCOUNTER — Ambulatory Visit
Admission: RE | Admit: 2015-09-14 | Discharge: 2015-09-14 | Disposition: A | Discharge status: Home / Self Care | Payer: No Typology Code available for payment source | Source: Ambulatory Visit | Attending: Obstetrics and Gynecology | Admitting: Obstetrics and Gynecology

## 2015-09-14 DIAGNOSIS — N6320 Unspecified lump in the left breast, unspecified quadrant: Secondary | ICD-10-CM

## 2016-03-30 ENCOUNTER — Other Ambulatory Visit (HOSPITAL_COMMUNITY): Payer: Self-pay | Admitting: Surgery

## 2016-04-14 ENCOUNTER — Encounter (HOSPITAL_COMMUNITY): Payer: Self-pay

## 2016-04-14 ENCOUNTER — Ambulatory Visit (HOSPITAL_COMMUNITY)
Admission: RE | Admit: 2016-04-14 | Discharge: 2016-04-14 | Disposition: A | Discharge status: Home / Self Care | Payer: BLUE CROSS/BLUE SHIELD | Source: Ambulatory Visit | Attending: Surgery | Admitting: Surgery

## 2016-04-14 ENCOUNTER — Other Ambulatory Visit: Payer: Self-pay

## 2016-05-10 ENCOUNTER — Ambulatory Visit: Payer: BLUE CROSS/BLUE SHIELD | Admitting: Dietician

## 2016-06-07 DIAGNOSIS — M545 Low back pain: Secondary | ICD-10-CM | POA: Diagnosis not present

## 2016-06-08 ENCOUNTER — Other Ambulatory Visit: Payer: Self-pay | Admitting: Orthopedic Surgery

## 2016-06-08 ENCOUNTER — Other Ambulatory Visit: Payer: Self-pay | Admitting: Sports Medicine

## 2016-06-08 DIAGNOSIS — M545 Low back pain, unspecified: Secondary | ICD-10-CM

## 2016-06-08 DIAGNOSIS — G8929 Other chronic pain: Secondary | ICD-10-CM

## 2016-06-21 ENCOUNTER — Inpatient Hospital Stay: Admission: RE | Admit: 2016-06-21 | Payer: BLUE CROSS/BLUE SHIELD | Source: Ambulatory Visit

## 2016-06-22 ENCOUNTER — Ambulatory Visit: Payer: BLUE CROSS/BLUE SHIELD | Admitting: Skilled Nursing Facility1

## 2016-06-25 DIAGNOSIS — J0140 Acute pansinusitis, unspecified: Secondary | ICD-10-CM | POA: Diagnosis not present

## 2016-06-25 DIAGNOSIS — M7712 Lateral epicondylitis, left elbow: Secondary | ICD-10-CM | POA: Diagnosis not present

## 2016-07-07 ENCOUNTER — Other Ambulatory Visit: Payer: BLUE CROSS/BLUE SHIELD

## 2016-07-11 ENCOUNTER — Encounter: Payer: Self-pay | Admitting: Skilled Nursing Facility1

## 2016-07-11 ENCOUNTER — Encounter: Payer: BLUE CROSS/BLUE SHIELD | Attending: Surgery | Admitting: Skilled Nursing Facility1

## 2016-07-11 DIAGNOSIS — Z90710 Acquired absence of both cervix and uterus: Secondary | ICD-10-CM | POA: Insufficient documentation

## 2016-07-11 DIAGNOSIS — I10 Essential (primary) hypertension: Secondary | ICD-10-CM | POA: Insufficient documentation

## 2016-07-11 DIAGNOSIS — Z713 Dietary counseling and surveillance: Secondary | ICD-10-CM | POA: Insufficient documentation

## 2016-07-11 DIAGNOSIS — G4733 Obstructive sleep apnea (adult) (pediatric): Secondary | ICD-10-CM | POA: Insufficient documentation

## 2016-07-11 DIAGNOSIS — E6609 Other obesity due to excess calories: Secondary | ICD-10-CM

## 2016-07-11 NOTE — Patient Instructions (Signed)
Follow Pre-Op Goals Try Protein Shakes Call [facility] at [phone] when surgery is scheduled to enroll in Pre-Op Class  Things to remember:  Please always be honest with us. We want to support you!  If you have any questions or concerns in between appointments, please call or email Liz, Leslie, or Laurie.  The diet after surgery will be high protein and low in carbohydrate.  Vitamins and calcium need to be taken for the rest of your life.  Feel free to include support people in any classes or appointments.   Supplement recommendations:  Before Surgery   1 Complete Multivitamin with Iron  3000 IU Vitamin D3  After Surgery   2 Chewable Multivitamins  **Best Choice - Bariatric Advantage Advanced Multi EA      3 Chewable Calcium (500 mg each, total 1200-1500 mg per day)  **Best Choice - Celebrate, Bariatric Advantage, or Wellesse  Other Options:    2 Flinstones Complete + up to 100 mg Thiamin + 2000-3000 IU Vitamin D3 + 350-500 mcg Vitamin B12 + 30-45 mg Iron (with history of deficiency)  2 Celebrate MultiComplete with 18 mg Iron (this provides 6000 IU of  Vitamin D3)  4 Celebrate Essential Multi 2 in 1 (has calcium) + 18-60 mg separate  iron  Vitamins and Calcium are available at:   [facility]   [address], [place], [place] [address]   [email or web address]  [email or web address]  [email or web address]   

## 2016-07-11 NOTE — Progress Notes (Signed)
  Pre-Op Assessment Visit:  Pre-Operative Sleeve Gastrectomy Surgery  Medical Nutrition Therapy:  Appt start time: 3:15   End time: 4:15  Patient was seen on 07/11/2016 for Pre-Operative Nutrition Assessment. Assessment and letter of approval faxed to [facility] Bariatric Surgery Program coordinator on 07/11/2016.   Preferred Learning Style:   No preference indicated   Learning Readiness:   Ready  Handouts given during visit include:  Pre-Op Goals Bariatric Surgery Protein Shakes  During the appointment today the following Pre-Op Goals were reviewed with the patient: Maintain or lose weight as instructed by your surgeon Make healthy food choices Begin to limit portion sizes Limited concentrated sugars and fried foods Keep fat/sugar in the single digits per serving on   food labels Practice CHEWING your food  (aim for 30 chews per bite or until applesauce consistency) Practice not drinking 15 minutes before, during, and 30 minutes after each meal/snack Avoid all carbonated beverages  Avoid/limit caffeinated beverages  Avoid all sugar-sweetened beverages Consume 3 meals per day; eat every 3-5 hours Make a list of non-food related activities Aim for 64-100 ounces of FLUID daily  Aim for at least 60-80 grams of PROTEIN daily Look for a liquid protein source that contain ?15 g protein and ?5 g carbohydrate  (ex: shakes, drinks, shots)  Patient-Centered Goals: 10/10 specific/non-scale and confidence/importance scale 1-10  Demonstrated degree of understanding via:  Teach Back  Teaching Method Utilized:  Visual Auditory Hands on  Barriers to learning/adherence to lifestyle change: none identified  [facility] to call the [facility] to enroll in Pre-Op and Post-Op Nutrition Education when surgery date is scheduled.

## 2016-08-02 DIAGNOSIS — I10 Essential (primary) hypertension: Secondary | ICD-10-CM | POA: Diagnosis not present

## 2016-08-13 DIAGNOSIS — J0140 Acute pansinusitis, unspecified: Secondary | ICD-10-CM | POA: Diagnosis not present

## 2016-08-13 DIAGNOSIS — J209 Acute bronchitis, unspecified: Secondary | ICD-10-CM | POA: Diagnosis not present

## 2016-09-07 DIAGNOSIS — Z713 Dietary counseling and surveillance: Secondary | ICD-10-CM | POA: Diagnosis not present

## 2016-10-07 DIAGNOSIS — F509 Eating disorder, unspecified: Secondary | ICD-10-CM | POA: Diagnosis not present

## 2016-11-09 DIAGNOSIS — M25561 Pain in right knee: Secondary | ICD-10-CM | POA: Diagnosis not present

## 2016-11-09 DIAGNOSIS — J45909 Unspecified asthma, uncomplicated: Secondary | ICD-10-CM | POA: Diagnosis not present

## 2016-12-11 DIAGNOSIS — Z713 Dietary counseling and surveillance: Secondary | ICD-10-CM | POA: Diagnosis not present

## 2016-12-11 DIAGNOSIS — Z6841 Body Mass Index (BMI) 40.0 and over, adult: Secondary | ICD-10-CM | POA: Diagnosis not present

## 2016-12-12 DIAGNOSIS — A6004 Herpesviral vulvovaginitis: Secondary | ICD-10-CM | POA: Diagnosis not present

## 2016-12-12 DIAGNOSIS — Z130 Encounter for screening for diseases of the blood and blood-forming organs and certain disorders involving the immune mechanism: Secondary | ICD-10-CM | POA: Diagnosis not present

## 2016-12-12 DIAGNOSIS — Z1389 Encounter for screening for other disorder: Secondary | ICD-10-CM | POA: Diagnosis not present

## 2016-12-12 DIAGNOSIS — Z113 Encounter for screening for infections with a predominantly sexual mode of transmission: Secondary | ICD-10-CM | POA: Diagnosis not present

## 2016-12-12 DIAGNOSIS — Z6841 Body Mass Index (BMI) 40.0 and over, adult: Secondary | ICD-10-CM | POA: Diagnosis not present

## 2016-12-12 DIAGNOSIS — Z01419 Encounter for gynecological examination (general) (routine) without abnormal findings: Secondary | ICD-10-CM | POA: Diagnosis not present

## 2017-01-18 DIAGNOSIS — F509 Eating disorder, unspecified: Secondary | ICD-10-CM | POA: Diagnosis not present

## 2017-02-19 ENCOUNTER — Encounter: Payer: BLUE CROSS/BLUE SHIELD | Attending: Surgery | Admitting: Skilled Nursing Facility1

## 2017-02-19 DIAGNOSIS — E669 Obesity, unspecified: Secondary | ICD-10-CM

## 2017-02-19 DIAGNOSIS — Z713 Dietary counseling and surveillance: Secondary | ICD-10-CM | POA: Diagnosis present

## 2017-02-19 DIAGNOSIS — Z6841 Body Mass Index (BMI) 40.0 and over, adult: Secondary | ICD-10-CM | POA: Diagnosis not present

## 2017-02-21 NOTE — Progress Notes (Signed)
  Pre-Operative Nutrition Class:  Appt start time: 1540   End time:  1830.  Patient was seen on 02/19/17 for Pre-Operative Bariatric Surgery Education at the [facility].   Surgery date:  Surgery type: Sleeve Gastrectomy Start weight at [facility]: 318 lb Weight today: Pt denied  TANITA  BODY COMP RESULTS     BMI (kg/m^2)    Fat Mass (lbs)    Fat Free Mass (lbs)    Total Body Water (lbs)    Samples given per MNT protocol. Patient educated on appropriate usage: Bariatric Advantage Multivitamin Lot # [id number] Exp: 06/19  Bariatric Fusion Calcium Citrate Lot # [id number] Exp: 12/27/17  Premier Clear Protein Drink Lot # [id number] Exp: 07/24/17 The following the learning objectives were met by the patient during this course:  Identify Pre-Op Dietary Goals and will begin 2 weeks pre-operatively  Identify appropriate sources of fluids and proteins   State protein recommendations and appropriate sources pre and post-operatively  Identify Post-Operative Dietary Goals and will follow for 2 weeks post-operatively  Identify appropriate multivitamin and calcium sources  Describe the need for physical activity post-operatively and will follow MD recommendations  State when to call healthcare provider regarding medication questions or post-operative complications  Handouts given during class include:  Pre-Op Bariatric Surgery Diet Handout  Protein Shake Handout  Post-Op Bariatric Surgery Nutrition Handout  [facility] Program Information Flyer  Support Group Information Flyer  [facility] Bariatric Supplements Price List  Follow-Up Plan: Patient will follow-up at [facility] 2 weeks post operatively for diet advancement per MD.

## 2017-03-08 ENCOUNTER — Ambulatory Visit: Payer: Self-pay | Admitting: Surgery

## 2017-03-08 NOTE — H&P (Signed)
Chief Complaint:  Morbid obesity for sleeve gastrectomy   History of Present Illness:  Laylana Hamilton is an 41 y.o. female who works as a Building control surveyor at [employer] and who has a BMI of 51 and who wants a sleeve gastrectomy.  Informed consent performed.  She had upper endo by Dr. Darnell Level on prior workup and he mentioned a hiatal hernia but on recent UGI they saw no hiatal hernia.  She does have symptomatic GER so we will look for a hiatal hernia to fix.    Past Medical History:  Diagnosis Date  . Acne    Dr. Allyson Sabal  . Anemia    history of due to fibroids  . Asthma    allergy induced  . Fibroids    Dr. Willis Modena  . GERD (gastroesophageal reflux disease)   . History of hiatal hernia   . Hypertension   . Morbid obesity ([place])   . Sleep apnea    uses CPAP    Past Surgical History:  Procedure Laterality Date  . LAPAROSCOPIC HYSTERECTOMY N/A 03/03/2015   Procedure: HYSTERECTOMY TOTAL LAPAROSCOPIC;  Surgeon: Cheri Fowler, MD;  Location: [facility];  Service: Gynecology;  Laterality: N/A;  . REPAIR VAGINAL CUFF N/A 03/03/2015   Procedure: REPAIR VAGINAL CUFF;  Surgeon: Cheri Fowler, MD;  Location: [facility];  Service: Gynecology;  Laterality: N/A;  . WISDOM TOOTH EXTRACTION      Current Outpatient Prescriptions  Medication Sig Dispense Refill  . albuterol (PROVENTIL HFA;VENTOLIN HFA) 108 (90 BASE) MCG/ACT inhaler Inhale 2 puffs into the lungs every 6 (six) hours as needed for wheezing or shortness of breath.    Marland Kitchen amLODipine (NORVASC) 5 MG tablet Take 5 mg by mouth daily. Amlodipine Besylate 5 MG Tablet 1 tablet Once a day    . doxycycline (VIBRAMYCIN) 100 MG capsule Take 100 mg by mouth daily as needed (for breakouts).    . hydrochlorothiazide (HYDRODIURIL) 25 MG tablet Take 25 mg by mouth daily. Hydrochlorothiazide 25 MG Tablet 1 tablet Once a day    . ibuprofen (ADVIL,MOTRIN) 200 MG tablet Take 800 mg by mouth every 6 (six) hours as needed for moderate pain.    Marland Kitchen ibuprofen (ADVIL,MOTRIN)  600 MG tablet Take 1 tablet (600 mg total) by mouth every 6 (six) hours as needed (mild pain). 30 tablet 0  . oxyCODONE-acetaminophen (PERCOCET/ROXICET) 5-325 MG per tablet Take 1-2 tablets by mouth every 4 (four) hours as needed for severe pain (moderate to severe pain (when tolerating fluids)). 30 tablet 0  . potassium chloride SA (K-DUR,KLOR-CON) 20 MEQ tablet Take 20 mEq by mouth 2 (two) times daily.    . valACYclovir (VALTREX) 1000 MG tablet Take 500 mg by mouth daily as needed (for outbreaks).     No current facility-administered medications for this visit.    Lisinopril No family history on file. Social History:   reports that she has never smoked. She does not have any smokeless tobacco history on file. She reports that she does not drink alcohol or use drugs.   REVIEW OF SYSTEMS : Negative except for see problem list  Physical Exam:   Last menstrual period 02/21/2015. There is no height or weight on file to calculate BMI.  Gen:  WDWN AAF NAD  Neurological: Alert and oriented to person, place, and time. Motor and sensory function is grossly intact  Head: Normocephalic and atraumatic.  Eyes: Conjunctivae are normal. Pupils are equal, round, and reactive to light. No scleral icterus.  Neck: Normal range  of motion. Neck supple. No tracheal deviation or thyromegaly present.  Cardiovascular:  SR without murmurs or gallops.  No carotid bruits Breast:  Not examined Respiratory: Effort normal.  No respiratory distress. No chest wall tenderness. Breath sounds normal.  No wheezes, rales or rhonchi.  Abdomen:  Obese and nontender GU:  Not examined Musculoskeletal: Normal range of motion. Extremities are nontender. No cyanosis, edema or clubbing noted Lymphadenopathy: No cervical, preauricular, postauricular or axillary adenopathy is present Skin: Skin is warm and dry. No rash noted. No diaphoresis. No erythema. No pallor. Pscyh: Normal mood and affect. Behavior is normal. Judgment and  thought content normal.   LABORATORY RESULTS: No results found for this or any previous visit (from the past 48 hour(s)).   RADIOLOGY RESULTS: No results found.  Problem List: Patient Active Problem List   Diagnosis Date Noted  . S/P hysterectomy with oophorectomy 03/03/2015    Assessment & Plan: Morbid obesity BMI 51, hx of GER --for lap sleeve gastrectomy and possible repair of hiatal hernia    Matt B. Hassell Done, MD, [facility] [phone] beeper [phone]  03/08/2017 11:21 AM

## 2017-03-22 NOTE — Patient Instructions (Addendum)
Sydney Hamilton  03/22/2017   Your procedure is scheduled on: 04-03-17  Report to [facility] Main  Entrance Take [place]  elevators to 3rd floor to  [place] at [phone].   Call this number if you have problems the morning of surgery [phone]    Remember: ONLY 1 PERSON MAY GO WITH YOU TO SHORT STAY TO GET  READY MORNING OF [place].  Do not eat food or drink liquids :After Midnight.    Bring CPAP mask and tubing . Device will be provided!   Take these medicines the morning of surgery with A SIP OF WATER: AMLODIPINE(NORVASC), INHALER AS NEEDED ([place])                                You may not have any metal on your body including hair pins and              piercings  Do not wear jewelry, make-up, lotions, powders or perfumes, deodorant             Do not wear nail polish.  Do not shave  48 hours prior to surgery.             Do not bring valuables to the hospital. [place].  Contacts, dentures or bridgework may not be worn into surgery.  Leave suitcase in the car. After surgery it may be brought to your room.              Please read over the following fact sheets you were given: _____________________________________________________________________           [facility] - Preparing for Surgery Before surgery, you can play an important role.  Because skin is not sterile, your skin needs to be as free of germs as possible.  You can reduce the number of germs on your skin by washing with CHG (chlorahexidine gluconate) soap before surgery.  CHG is an antiseptic cleaner which kills germs and bonds with the skin to continue killing germs even after washing. Please DO NOT use if you have an allergy to CHG or antibacterial soaps.  If your skin becomes reddened/irritated stop using the CHG and inform your nurse when you arrive at Short Stay. Do not shave (including legs and underarms) for at  least 48 hours prior to the first CHG shower.  You may shave your face/neck. Please follow these instructions carefully:  1.  Shower with CHG Soap the night before surgery and the  morning of Surgery.  2.  If you choose to wash your hair, wash your hair first as usual with your  normal  shampoo.  3.  After you shampoo, rinse your hair and body thoroughly to remove the  shampoo.                           4.  Use CHG as you would any other liquid soap.  You can apply chg directly  to the skin and wash                       Gently with a scrungie or clean washcloth.  5.  Apply the CHG Soap to your body ONLY FROM THE NECK DOWN.   Do not use on face/ open                           Wound or open sores. Avoid contact with eyes, ears mouth and genitals (private parts).                       Wash face,  Genitals (private parts) with your normal soap.             6.  Wash thoroughly, paying special attention to the area where your surgery  will be performed.  7.  Thoroughly rinse your body with warm water from the neck down.  8.  DO NOT shower/wash with your normal soap after using and rinsing off  the CHG Soap.                9.  Pat yourself dry with a clean towel.            10.  Wear clean pajamas.            11.  Place clean sheets on your bed the night of your first shower and do not  sleep with pets. Day of Surgery : Do not apply any lotions/deodorants the morning of surgery.  Please wear clean clothes to the hospital/surgery center.  FAILURE TO FOLLOW THESE INSTRUCTIONS MAY RESULT IN THE CANCELLATION OF YOUR SURGERY PATIENT SIGNATURE_________________________________  NURSE SIGNATURE__________________________________  ________________________________________________________________________   Adam Phenix  An incentive spirometer is a tool that can help keep your lungs clear and active. This tool measures how well you are filling your lungs with each breath. Taking long deep breaths  may help reverse or decrease the chance of developing breathing (pulmonary) problems (especially infection) following:  A long period of time when you are unable to move or be active. BEFORE THE PROCEDURE   If the spirometer includes an indicator to show your best effort, your nurse or respiratory therapist will set it to a desired goal.  If possible, sit up straight or lean slightly forward. Try not to slouch.  Hold the incentive spirometer in an upright position. INSTRUCTIONS FOR USE  1. Sit on the edge of your bed if possible, or sit up as far as you can in bed or on a chair. 2. Hold the incentive spirometer in an upright position. 3. Breathe out normally. 4. Place the mouthpiece in your mouth and seal your lips tightly around it. 5. Breathe in slowly and as deeply as possible, raising the piston or the ball toward the top of the column. 6. Hold your breath for 3-5 seconds or for as long as possible. Allow the piston or ball to fall to the bottom of the column. 7. Remove the mouthpiece from your mouth and breathe out normally. 8. Rest for a few seconds and repeat Steps 1 through 7 at least 10 times every 1-2 hours when you are awake. Take your time and take a few normal breaths between deep breaths. 9. The spirometer may include an indicator to show your best effort. Use the indicator as a goal to work toward during each repetition. 10. After each set of 10 deep breaths, practice coughing to be sure your lungs are clear. If you have an incision (the cut made at the time of surgery), support your incision when coughing by placing a pillow or  rolled up towels firmly against it. Once you are able to get out of bed, walk around indoors and cough well. You may stop using the incentive spirometer when instructed by your caregiver.  RISKS AND COMPLICATIONS  Take your time so you do not get dizzy or light-headed.  If you are in pain, you may need to take or ask for pain medication before doing  incentive spirometry. It is harder to take a deep breath if you are having pain. AFTER USE  Rest and breathe slowly and easily.  It can be helpful to keep track of a log of your progress. Your caregiver can provide you with a simple table to help with this. If you are using the spirometer at home, follow these instructions: [place] IF:   You are having difficultly using the spirometer.  You have trouble using the spirometer as often as instructed.  Your pain medication is not giving enough relief while using the spirometer.  You develop fever of 100.5 F (38.1 C) or higher. SEEK IMMEDIATE MEDICAL CARE IF:   You cough up bloody sputum that had not been present before.  You develop fever of 102 F (38.9 C) or greater.  You develop worsening pain at or near the incision site. MAKE SURE YOU:   Understand these instructions.  Will watch your condition.  Will get help right away if you are not doing well or get worse. Document Released: 12/25/2006 Document Revised: 11/06/2011 Document Reviewed: 02/25/2007 [facility] Patient Information 2014 [place], [place].   ________________________________________________________________________

## 2017-03-26 ENCOUNTER — Encounter (HOSPITAL_COMMUNITY): Payer: Self-pay

## 2017-03-26 ENCOUNTER — Encounter (INDEPENDENT_AMBULATORY_CARE_PROVIDER_SITE_OTHER): Payer: Self-pay

## 2017-03-26 ENCOUNTER — Encounter (HOSPITAL_COMMUNITY)
Admission: RE | Admit: 2017-03-26 | Discharge: 2017-03-26 | Disposition: A | Discharge status: Home / Self Care | Payer: BLUE CROSS/BLUE SHIELD | Source: Ambulatory Visit | Attending: Surgery | Admitting: Surgery

## 2017-03-26 DIAGNOSIS — Z01812 Encounter for preprocedural laboratory examination: Secondary | ICD-10-CM | POA: Insufficient documentation

## 2017-03-26 LAB — CBC WITH DIFFERENTIAL/PLATELET
BASOS ABS: 0 10*3/uL (ref 0.0–0.1)
BASOS PCT: 0 %
EOS PCT: 3 %
Eosinophils Absolute: 0.1 10*3/uL (ref 0.0–0.7)
HEMATOCRIT: 37.1 % (ref 36.0–46.0)
Hemoglobin: 12.5 g/dL (ref 12.0–15.0)
LYMPHS PCT: 41 %
Lymphs Abs: 2.3 10*3/uL (ref 0.7–4.0)
MCH: 28.3 pg (ref 26.0–34.0)
MCHC: 33.7 g/dL (ref 30.0–36.0)
MCV: 84.1 fL (ref 78.0–100.0)
MONO ABS: 0.5 10*3/uL (ref 0.1–1.0)
MONOS PCT: 9 %
NEUTROS ABS: 2.6 10*3/uL (ref 1.7–7.7)
Neutrophils Relative %: 47 %
PLATELETS: 211 10*3/uL (ref 150–400)
RBC: 4.41 MIL/uL (ref 3.87–5.11)
RDW: 14.5 % (ref 11.5–15.5)
WBC: 5.6 10*3/uL (ref 4.0–10.5)

## 2017-03-26 LAB — COMPREHENSIVE METABOLIC PANEL
ALT: 22 U/L (ref 14–54)
AST: 20 U/L (ref 15–41)
Albumin: 3.7 g/dL (ref 3.5–5.0)
Alkaline Phosphatase: 66 U/L (ref 38–126)
Anion gap: 8 (ref 5–15)
BILIRUBIN TOTAL: 0.8 mg/dL (ref 0.3–1.2)
BUN: 12 mg/dL (ref 6–20)
CALCIUM: 8.9 mg/dL (ref 8.9–10.3)
CO2: 24 mmol/L (ref 22–32)
CREATININE: 0.68 mg/dL (ref 0.44–1.00)
Chloride: 101 mmol/L (ref 101–111)
GFR calc Af Amer: >60 mL/min (ref >60)
Glucose, Bld: 77 mg/dL (ref 65–99)
Potassium: 3.5 mmol/L (ref 3.5–5.1)
Sodium: 133 mmol/L — ABNORMAL LOW (ref 135–145)
TOTAL PROTEIN: 7.6 g/dL (ref 6.5–8.1)

## 2017-04-03 ENCOUNTER — Encounter (HOSPITAL_COMMUNITY): Payer: Self-pay | Admitting: *Deleted

## 2017-04-03 ENCOUNTER — Inpatient Hospital Stay (HOSPITAL_COMMUNITY): Payer: BLUE CROSS/BLUE SHIELD | Admitting: Anesthesiology

## 2017-04-03 ENCOUNTER — Inpatient Hospital Stay (HOSPITAL_COMMUNITY)
Admission: RE | Admit: 2017-04-03 | Discharge: 2017-04-04 | DRG: 621 | Disposition: A | Discharge status: Home / Self Care | Payer: BLUE CROSS/BLUE SHIELD | Attending: Surgery | Admitting: Surgery

## 2017-04-03 ENCOUNTER — Encounter (HOSPITAL_COMMUNITY)
Admission: RE | Disposition: A | Discharge status: Home / Self Care | Payer: Self-pay | Source: Home / Self Care | Attending: Surgery

## 2017-04-03 DIAGNOSIS — K219 Gastro-esophageal reflux disease without esophagitis: Secondary | ICD-10-CM | POA: Diagnosis not present

## 2017-04-03 DIAGNOSIS — Z79899 Other long term (current) drug therapy: Secondary | ICD-10-CM

## 2017-04-03 DIAGNOSIS — G4733 Obstructive sleep apnea (adult) (pediatric): Secondary | ICD-10-CM | POA: Diagnosis present

## 2017-04-03 DIAGNOSIS — Z6841 Body Mass Index (BMI) 40.0 and over, adult: Secondary | ICD-10-CM | POA: Diagnosis not present

## 2017-04-03 DIAGNOSIS — K2950 Unspecified chronic gastritis without bleeding: Secondary | ICD-10-CM | POA: Diagnosis not present

## 2017-04-03 DIAGNOSIS — J45909 Unspecified asthma, uncomplicated: Secondary | ICD-10-CM | POA: Diagnosis present

## 2017-04-03 DIAGNOSIS — I10 Essential (primary) hypertension: Secondary | ICD-10-CM | POA: Diagnosis not present

## 2017-04-03 DIAGNOSIS — K449 Diaphragmatic hernia without obstruction or gangrene: Secondary | ICD-10-CM | POA: Diagnosis present

## 2017-04-03 DIAGNOSIS — Z9884 Bariatric surgery status: Secondary | ICD-10-CM

## 2017-04-03 HISTORY — PX: LAPAROSCOPIC GASTRIC SLEEVE RESECTION: SHX5895

## 2017-04-03 LAB — CBC
HEMATOCRIT: 37.9 % (ref 36.0–46.0)
HEMOGLOBIN: 12.9 g/dL (ref 12.0–15.0)
MCH: 28.7 pg (ref 26.0–34.0)
MCHC: 34 g/dL (ref 30.0–36.0)
MCV: 84.4 fL (ref 78.0–100.0)
Platelets: 192 10*3/uL (ref 150–400)
RBC: 4.49 MIL/uL (ref 3.87–5.11)
RDW: 14.2 % (ref 11.5–15.5)
WBC: 11.4 10*3/uL — AB (ref 4.0–10.5)

## 2017-04-03 LAB — CREATININE, SERUM: CREATININE: 0.9 mg/dL (ref 0.44–1.00)

## 2017-04-03 SURGERY — GASTRECTOMY, SLEEVE, LAPAROSCOPIC
Anesthesia: General | Site: Abdomen

## 2017-04-03 MED ORDER — CEFOTETAN DISODIUM-DEXTROSE 2-2.08 GM-% IV SOLR: 2.0000 g | INTRAVENOUS | Status: DC

## 2017-04-03 MED ORDER — ACETAMINOPHEN 160 MG/5ML PO SOLN: 325.0000 mg | ORAL | Status: DC | PRN

## 2017-04-03 MED ORDER — BUPIVACAINE LIPOSOME 1.3 % IJ SUSP
20.0000 mL | Freq: Once | INTRAMUSCULAR | Status: AC
  Administered 2017-04-03: 20 mL
  Filled 2017-04-03: qty 20

## 2017-04-03 MED ORDER — ACETAMINOPHEN 10 MG/ML IV SOLN
INTRAVENOUS | Status: DC | PRN
  Administered 2017-04-03: 2 mg via INTRAVENOUS

## 2017-04-03 MED ORDER — DEXAMETHASONE SODIUM PHOSPHATE 4 MG/ML IJ SOLN
4.0000 mg | INTRAMUSCULAR | Status: AC
  Administered 2017-04-03: 10 mg via INTRAVENOUS

## 2017-04-03 MED ORDER — ACETAMINOPHEN 325 MG PO TABS: 650.0000 mg | ORAL_TABLET | ORAL | Status: DC | PRN

## 2017-04-03 MED ORDER — GLYCOPYRROLATE 0.2 MG/ML IV SOSY
PREFILLED_SYRINGE | INTRAVENOUS | Status: AC
  Filled 2017-04-03: qty 5

## 2017-04-03 MED ORDER — ONDANSETRON HCL 4 MG/2ML IJ SOLN: 4.0000 mg | INTRAMUSCULAR | Status: DC | PRN

## 2017-04-03 MED ORDER — ROCURONIUM BROMIDE 100 MG/10ML IV SOLN
INTRAVENOUS | Status: DC | PRN
  Administered 2017-04-03: 50 mg via INTRAVENOUS
  Administered 2017-04-03: 10 mg via INTRAVENOUS
  Administered 2017-04-03: 20 mg via INTRAVENOUS

## 2017-04-03 MED ORDER — FENTANYL CITRATE (PF) 100 MCG/2ML IJ SOLN: INTRAMUSCULAR | Status: DC | PRN

## 2017-04-03 MED ORDER — PROPOFOL 10 MG/ML IV BOLUS
INTRAVENOUS | Status: DC | PRN
  Administered 2017-04-03: 60 mg via INTRAVENOUS
  Administered 2017-04-03: 30 mg via INTRAVENOUS
  Administered 2017-04-03: 200 mg via INTRAVENOUS
  Administered 2017-04-03: 60 mg via INTRAVENOUS

## 2017-04-03 MED ORDER — CHLORHEXIDINE GLUCONATE CLOTH 2 % EX PADS: 6.0000 | MEDICATED_PAD | Freq: Once | CUTANEOUS | Status: DC

## 2017-04-03 MED ORDER — APREPITANT 40 MG PO CAPS
40.0000 mg | ORAL_CAPSULE | ORAL | Status: AC
  Administered 2017-04-03: 40 mg via ORAL
  Filled 2017-04-03: qty 1

## 2017-04-03 MED ORDER — LACTATED RINGERS IV SOLN
INTRAVENOUS | Status: DC
  Administered 2017-04-03 (×2): via INTRAVENOUS
  Administered 2017-04-03: 1000 mL via INTRAVENOUS

## 2017-04-03 MED ORDER — LIDOCAINE 2% (20 MG/ML) 5 ML SYRINGE
INTRAMUSCULAR | Status: AC
  Filled 2017-04-03: qty 5

## 2017-04-03 MED ORDER — MIDAZOLAM HCL 5 MG/5ML IJ SOLN
INTRAMUSCULAR | Status: DC | PRN
  Administered 2017-04-03: 2 mg via INTRAVENOUS

## 2017-04-03 MED ORDER — KETAMINE HCL 10 MG/ML IJ SOLN
INTRAMUSCULAR | Status: DC | PRN
  Administered 2017-04-03: 10 mg via INTRAVENOUS
  Administered 2017-04-03: 40 mg via INTRAVENOUS

## 2017-04-03 MED ORDER — HEPARIN SODIUM (PORCINE) 5000 UNIT/ML IJ SOLN
5000.0000 [IU] | Freq: Three times a day (TID) | INTRAMUSCULAR | Status: DC
  Administered 2017-04-03 – 2017-04-04 (×3): 5000 [IU] via SUBCUTANEOUS
  Filled 2017-04-03 (×2): qty 1

## 2017-04-03 MED ORDER — MORPHINE SULFATE (PF) 2 MG/ML IV SOLN
1.0000 mg | INTRAVENOUS | Status: DC | PRN
  Administered 2017-04-03 (×2): 2 mg via INTRAVENOUS
  Filled 2017-04-03 (×2): qty 1

## 2017-04-03 MED ORDER — ONDANSETRON HCL 4 MG/2ML IJ SOLN
INTRAMUSCULAR | Status: DC | PRN
  Administered 2017-04-03: 4 mg via INTRAVENOUS

## 2017-04-03 MED ORDER — ACETAMINOPHEN 500 MG PO TABS
1000.0000 mg | ORAL_TABLET | ORAL | Status: AC
  Administered 2017-04-03: 1000 mg via ORAL
  Filled 2017-04-03: qty 2

## 2017-04-03 MED ORDER — CELECOXIB 200 MG PO CAPS
400.0000 mg | ORAL_CAPSULE | ORAL | Status: AC
  Administered 2017-04-03: 400 mg via ORAL
  Filled 2017-04-03: qty 2

## 2017-04-03 MED ORDER — SUGAMMADEX SODIUM 200 MG/2ML IV SOLN
INTRAVENOUS | Status: DC | PRN
  Administered 2017-04-03: 200 mg via INTRAVENOUS

## 2017-04-03 MED ORDER — SODIUM CHLORIDE 0.9 % IJ SOLN
INTRAMUSCULAR | Status: DC | PRN
  Administered 2017-04-03: 10 mL

## 2017-04-03 MED ORDER — PROMETHAZINE HCL 25 MG/ML IJ SOLN
INTRAMUSCULAR | Status: AC
  Administered 2017-04-03: 18:00:00
  Filled 2017-04-03: qty 1

## 2017-04-03 MED ORDER — LIDOCAINE 2% (20 MG/ML) 5 ML SYRINGE
INTRAMUSCULAR | Status: DC | PRN
  Administered 2017-04-03: 1.5 mg/kg/h via INTRAVENOUS

## 2017-04-03 MED ORDER — ONDANSETRON HCL 4 MG/2ML IJ SOLN
INTRAMUSCULAR | Status: AC
  Filled 2017-04-03: qty 2

## 2017-04-03 MED ORDER — HYDRALAZINE HCL 20 MG/ML IJ SOLN
10.0000 mg | Freq: Four times a day (QID) | INTRAMUSCULAR | Status: DC | PRN
  Administered 2017-04-03: 10 mg via INTRAVENOUS
  Filled 2017-04-03: qty 0.5

## 2017-04-03 MED ORDER — CEFOTETAN DISODIUM-DEXTROSE 2-2.08 GM-% IV SOLR
2.0000 g | INTRAVENOUS | Status: AC
  Administered 2017-04-03 (×2): 2 g via INTRAVENOUS
  Filled 2017-04-03: qty 50

## 2017-04-03 MED ORDER — HEPARIN SODIUM (PORCINE) 5000 UNIT/ML IJ SOLN
5000.0000 [IU] | INTRAMUSCULAR | Status: AC
  Administered 2017-04-03: 5000 [IU] via SUBCUTANEOUS
  Filled 2017-04-03: qty 1

## 2017-04-03 MED ORDER — PREMIER PROTEIN SHAKE
2.0000 [oz_av] | ORAL | Status: DC
  Administered 2017-04-04 (×6): 2 [oz_av] via ORAL

## 2017-04-03 MED ORDER — OXYCODONE HCL 5 MG/5ML PO SOLN
5.0000 mg | ORAL | Status: DC | PRN
  Administered 2017-04-04 (×5): 10 mg via ORAL
  Filled 2017-04-03 (×5): qty 10

## 2017-04-03 MED ORDER — 0.9 % SODIUM CHLORIDE (POUR BTL) OPTIME
TOPICAL | Status: DC | PRN
  Administered 2017-04-03: 1000 mL

## 2017-04-03 MED ORDER — PHENYLEPHRINE HCL 10 MG/ML IJ SOLN
INTRAMUSCULAR | Status: DC | PRN
  Administered 2017-04-03 (×4): 80 ug via INTRAVENOUS

## 2017-04-03 MED ORDER — PROPOFOL 10 MG/ML IV BOLUS
INTRAVENOUS | Status: AC
  Filled 2017-04-03: qty 40

## 2017-04-03 MED ORDER — DEXAMETHASONE SODIUM PHOSPHATE 10 MG/ML IJ SOLN
INTRAMUSCULAR | Status: AC
  Filled 2017-04-03: qty 1

## 2017-04-03 MED ORDER — PHENYLEPHRINE 40 MCG/ML (10ML) SYRINGE FOR IV PUSH (FOR BLOOD PRESSURE SUPPORT)
PREFILLED_SYRINGE | INTRAVENOUS | Status: AC
  Filled 2017-04-03: qty 10

## 2017-04-03 MED ORDER — SCOPOLAMINE 1 MG/3DAYS TD PT72
1.0000 | MEDICATED_PATCH | TRANSDERMAL | Status: DC
  Administered 2017-04-03: 1.5 mg via TRANSDERMAL
  Filled 2017-04-03: qty 1

## 2017-04-03 MED ORDER — ACETAMINOPHEN 500 MG PO TABS: 1000.0000 mg | ORAL_TABLET | ORAL | Status: DC

## 2017-04-03 MED ORDER — KCL IN DEXTROSE-NACL 20-5-0.45 MEQ/L-%-% IV SOLN
INTRAVENOUS | Status: DC
  Administered 2017-04-03 – 2017-04-04 (×2): 1000 mL via INTRAVENOUS
  Filled 2017-04-03 (×3): qty 1000

## 2017-04-03 MED ORDER — CELECOXIB 200 MG PO CAPS: 400.0000 mg | ORAL_CAPSULE | ORAL | Status: DC

## 2017-04-03 MED ORDER — GLYCOPYRROLATE 0.2 MG/ML IJ SOLN
INTRAMUSCULAR | Status: DC | PRN
  Administered 2017-04-03: 0.1 mg via INTRAVENOUS

## 2017-04-03 MED ORDER — SODIUM CHLORIDE 0.9 % IJ SOLN
INTRAMUSCULAR | Status: AC
  Filled 2017-04-03: qty 10

## 2017-04-03 MED ORDER — GABAPENTIN 300 MG PO CAPS
300.0000 mg | ORAL_CAPSULE | ORAL | Status: AC
  Administered 2017-04-03: 300 mg via ORAL
  Filled 2017-04-03: qty 1

## 2017-04-03 MED ORDER — PANTOPRAZOLE SODIUM 40 MG IV SOLR
40.0000 mg | Freq: Every day | INTRAVENOUS | Status: DC
  Administered 2017-04-03: 40 mg via INTRAVENOUS
  Filled 2017-04-03: qty 40

## 2017-04-03 MED ORDER — SUFENTANIL CITRATE 50 MCG/ML IV SOLN
INTRAVENOUS | Status: AC
  Filled 2017-04-03: qty 1

## 2017-04-03 MED ORDER — LACTATED RINGERS IR SOLN
Status: DC | PRN
  Administered 2017-04-03: 1000 mL

## 2017-04-03 MED ORDER — LIDOCAINE HCL (CARDIAC) 20 MG/ML IV SOLN
INTRAVENOUS | Status: DC | PRN
  Administered 2017-04-03: 75 mg via INTRAVENOUS

## 2017-04-03 MED ORDER — PROMETHAZINE HCL 25 MG/ML IJ SOLN
6.2500 mg | INTRAMUSCULAR | Status: DC | PRN
  Administered 2017-04-03: 6.25 mg via INTRAVENOUS

## 2017-04-03 MED ORDER — SUCCINYLCHOLINE CHLORIDE 20 MG/ML IJ SOLN
INTRAMUSCULAR | Status: DC | PRN
  Administered 2017-04-03: 80 mg via INTRAVENOUS
  Administered 2017-04-03: 120 mg via INTRAVENOUS

## 2017-04-03 MED ORDER — HYDROMORPHONE HCL-NACL 0.5-0.9 MG/ML-% IV SOSY
PREFILLED_SYRINGE | INTRAVENOUS | Status: AC
  Filled 2017-04-03: qty 3

## 2017-04-03 MED ORDER — HYDROMORPHONE HCL-NACL 0.5-0.9 MG/ML-% IV SOSY
0.2500 mg | PREFILLED_SYRINGE | INTRAVENOUS | Status: DC | PRN
  Administered 2017-04-03 (×2): 0.25 mg via INTRAVENOUS

## 2017-04-03 MED ORDER — MIDAZOLAM HCL 2 MG/2ML IJ SOLN
INTRAMUSCULAR | Status: AC
  Filled 2017-04-03: qty 2

## 2017-04-03 MED ORDER — SUFENTANIL CITRATE 50 MCG/ML IV SOLN
INTRAVENOUS | Status: DC | PRN
  Administered 2017-04-03 (×3): 5 ug via INTRAVENOUS
  Administered 2017-04-03: 10 ug via INTRAVENOUS
  Administered 2017-04-03: 5 ug via INTRAVENOUS

## 2017-04-03 MED ORDER — KETAMINE HCL 10 MG/ML IJ SOLN
INTRAMUSCULAR | Status: AC
  Filled 2017-04-03: qty 1

## 2017-04-03 SURGICAL SUPPLY — 66 items
ADH SKN CLS APL DERMABOND .7 (GAUZE/BANDAGES/DRESSINGS) ×1
APPLICATOR COTTON TIP 6IN STRL (MISCELLANEOUS) IMPLANT
APPLIER CLIP 5 13 M/L LIGAMAX5 (MISCELLANEOUS)
APPLIER CLIP ROT 10 11.4 M/L (STAPLE)
APPLIER CLIP ROT 13.4 12 LRG (CLIP)
APR CLP LRG 13.4X12 ROT 20 MLT (CLIP)
APR CLP MED LRG 11.4X10 (STAPLE)
APR CLP MED LRG 5 ANG JAW (MISCELLANEOUS)
BLADE SURG 15 STRL LF DISP TIS (BLADE) ×1 IMPLANT
BLADE SURG 15 STRL SS (BLADE) ×3
CABLE HIGH FREQUENCY MONO STRZ (ELECTRODE) ×3 IMPLANT
CLIP APPLIE 5 13 M/L LIGAMAX5 (MISCELLANEOUS) IMPLANT
CLIP APPLIE ROT 10 11.4 M/L (STAPLE) IMPLANT
CLIP APPLIE ROT 13.4 12 LRG (CLIP) IMPLANT
DERMABOND ADVANCED (GAUZE/BANDAGES/DRESSINGS) ×2
DERMABOND ADVANCED .7 DNX12 (GAUZE/BANDAGES/DRESSINGS) ×1 IMPLANT
DEVICE SUT QUICK LOAD TK 5 (STAPLE) IMPLANT
DEVICE SUT TI-KNOT TK 5X26 (MISCELLANEOUS) ×2 IMPLANT
DEVICE SUTURE ENDOST 10MM (ENDOMECHANICALS) ×3 IMPLANT
DEVICE TI KNOT TK5 (MISCELLANEOUS) ×1
DEVICE TROCAR PUNCTURE CLOSURE (ENDOMECHANICALS) ×3 IMPLANT
DISSECTOR BLUNT TIP ENDO 5MM (MISCELLANEOUS) ×3 IMPLANT
ELECT REM PT RETURN 15FT ADLT (MISCELLANEOUS) ×3 IMPLANT
GAUZE SPONGE 4X4 12PLY STRL (GAUZE/BANDAGES/DRESSINGS) IMPLANT
GLOVE BIOGEL M 8.0 STRL (GLOVE) ×3 IMPLANT
GOWN STRL REUS W/TWL XL LVL3 (GOWN DISPOSABLE) ×12 IMPLANT
HANDLE STAPLE EGIA 4 XL (STAPLE) ×3 IMPLANT
HOVERMATT SINGLE USE (MISCELLANEOUS) ×3 IMPLANT
IV LACTATED RINGERS 1000ML (IV SOLUTION) ×3 IMPLANT
KIT BASIN OR (CUSTOM PROCEDURE TRAY) ×3 IMPLANT
KIT DEFENDO BUTTON (KITS) ×3 IMPLANT
MARKER SKIN DUAL TIP RULER LAB (MISCELLANEOUS) ×3 IMPLANT
NEEDLE SPNL 22GX3.5 QUINCKE BK (NEEDLE) ×3 IMPLANT
NS IRRIG 1000ML POUR BTL (IV SOLUTION) ×3 IMPLANT
PACK UNIVERSAL I (CUSTOM PROCEDURE TRAY) ×3 IMPLANT
QUICK LOAD TK 5 (STAPLE)
RELOAD TRI 45 ART MED THCK BLK (STAPLE) ×3 IMPLANT
RELOAD TRI 45 ART MED THCK PUR (STAPLE) ×3 IMPLANT
RELOAD TRI 60 ART MED THCK BLK (STAPLE) ×3 IMPLANT
RELOAD TRI 60 ART MED THCK PUR (STAPLE) ×6 IMPLANT
SCISSORS LAP 5X45 EPIX DISP (ENDOMECHANICALS) IMPLANT
SET IRRIG TUBING LAPAROSCOPIC (IRRIGATION / IRRIGATOR) ×3 IMPLANT
SHEARS HARMONIC ACE PLUS 45CM (MISCELLANEOUS) ×3 IMPLANT
SLEEVE ADV FIXATION 5X100MM (TROCAR) ×6 IMPLANT
SLEEVE GASTRECTOMY 36FR VISIGI (MISCELLANEOUS) ×3 IMPLANT
SOLUTION ANTI FOG 6CC (MISCELLANEOUS) ×3 IMPLANT
SPONGE LAP 18X18 X RAY DECT (DISPOSABLE) ×3 IMPLANT
STAPLER VISISTAT 35W (STAPLE) ×3 IMPLANT
SUT MNCRL AB 4-0 PS2 18 (SUTURE) ×3 IMPLANT
SUT SURGIDAC NAB ES-9 0 48 120 (SUTURE) IMPLANT
SUT VIC AB 4-0 SH 18 (SUTURE) ×3 IMPLANT
SUT VICRYL 0 TIES 12 18 (SUTURE) ×3 IMPLANT
SYR 10ML ECCENTRIC (SYRINGE) ×3 IMPLANT
SYR 20CC LL (SYRINGE) ×3 IMPLANT
SYR 50ML LL SCALE MARK (SYRINGE) ×3 IMPLANT
TOWEL OR 17X26 10 PK STRL BLUE (TOWEL DISPOSABLE) ×6 IMPLANT
TOWEL OR NON WOVEN STRL DISP B (DISPOSABLE) ×3 IMPLANT
TRAY FOLEY W/METER SILVER 16FR (SET/KITS/TRAYS/PACK) IMPLANT
TROCAR ADV FIXATION 5X100MM (TROCAR) ×3 IMPLANT
TROCAR BLADELESS 15MM (ENDOMECHANICALS) ×3 IMPLANT
TROCAR BLADELESS OPT 5 100 (ENDOMECHANICALS) ×3 IMPLANT
TUBE CALIBRATION LAPBAND (TUBING) IMPLANT
TUBING CONNECTING 10 (TUBING) ×4 IMPLANT
TUBING CONNECTING 10' (TUBING) ×2
TUBING ENDO SMARTCAP (MISCELLANEOUS) ×3 IMPLANT
TUBING INSUF HEATED (TUBING) ×3 IMPLANT

## 2017-04-03 NOTE — Discharge Instructions (Signed)
° ° ° °GASTRIC BYPASS/SLEEVE ° Home Care Instructions ° ° These instructions are to help you care for yourself when you go home. ° °Call: If you have any problems. °• Call [phone] and ask for the surgeon on call °• If you need immediate assistance come to the ER at [facility]. Tell the ER staff you are a new post-op gastric bypass or gastric sleeve patient  °Signs and symptoms to report: • Severe  vomiting or nausea °o If you cannot handle clear liquids for longer than 1 day, call your surgeon °• Abdominal pain which does not get better after taking your pain medication °• Fever greater than 100.4°  F and chills °• Heart rate over 100 beats a minute °• Trouble breathing °• Chest pain °• Redness,  swelling, drainage, or foul odor at incision (surgical) sites °• If your incisions open or pull apart °• Swelling or pain in calf (lower leg) °• Diarrhea (Loose bowel movements that happen often), frequent watery, uncontrolled bowel movements °• Constipation, (no bowel movements for 3 days) if this happens: °o Take Milk of Magnesia, 2 tablespoons by mouth, 3 times a day for 2 days if needed °o Stop taking Milk of Magnesia once you have had a bowel movement °o Call your doctor if constipation continues °Or °o Take Miralax  (instead of Milk of Magnesia) following the label instructions °o Stop taking Miralax once you have had a bowel movement °o Call your doctor if constipation continues °• Anything you think is “abnormal for you” °  °Normal side effects after surgery: • Unable to sleep at night or unable to concentrate °• Irritability °• Being tearful (crying) or depressed ° °These are common complaints, possibly related to your anesthesia, stress of surgery, and change in lifestyle, that usually go away a few weeks after surgery. If these feelings continue, call your medical doctor.  °Wound Care: You may have surgical glue, steri-strips, or staples over your incisions after surgery °• Surgical glue: Looks like clear  film over your incisions and will wear off a little at a time °• Steri-strips: Adhesive strips of tape over your incisions. You may notice a yellowish color on skin under the steri-strips. This is used to make the steri-strips stick better. Do not pull the steri-strips off - let them fall off °• Staples: Staples may be removed before you leave the hospital °o If you go home with staples, call [facility] for an appointment with your surgeon’s nurse to have staples removed 10 days after surgery, [phone] °• Showering: You may shower two (2) days after your surgery unless your surgeon tells you differently °o Wash gently around incisions with warm soapy water, rinse well, and gently pat dry °o If you have a drain (tube from your incision), you may need someone to hold this while you shower °o No tub baths until staples are removed and incisions are healed °  °Medications: • Medications should be liquid or crushed if larger than the size of a dime °• Extended release pills (medication that releases a little bit at a time through the  day) should not be crushed °• Depending on the size and number of medications you take, you may need to space (take a few throughout the day)/change the time you take your medications so that you do not over-fill your pouch (smaller stomach) °• Make sure you follow-up with you primary care physician to make medication changes needed during rapid weight loss and life -style changes °•   If you have diabetes, follow up with your doctor that orders your diabetes medication(s) within one week after surgery and check your blood sugar regularly ° °• Do not drive while taking narcotics (pain medications) ° °• Do not take acetaminophen (Tylenol) and Roxicet or Lortab Elixir at the same time since these pain medications contain acetaminophen °  °Diet:  °First 2 Weeks You will see the nutritionist about two (2) weeks after your surgery. The nutritionist will increase the types of  foods you can eat if you are handling liquids well: °• If you have severe vomiting or nausea and cannot handle clear liquids lasting longer than 1 day call your surgeon °Protein Shake °• Drink at least 2 ounces of shake 5-6 times per day °• Each serving of protein shakes (usually 8-12 ounces) should have a minimum of: °o 15 grams of protein °o And no more than 5 grams of carbohydrate °• Goal for protein each day: °o Men = 80 grams per day °o Women = 60 grams per day °  ° • Protein powder may be added to fluids such as non-fat milk or Lactaid milk or Soy milk (limit to 35 grams added protein powder per serving) ° °Hydration °• Slowly increase the amount of water and other clear liquids as tolerated (See Acceptable Fluids) °• Slowly increase the amount of protein shake as tolerated °• Sip fluids slowly and throughout the day °• May use sugar substitutes in small amounts (no more than 6-8 packets per day; i.e. Splenda) ° °Fluid Goal °• The first goal is to drink at least 8 ounces of protein shake/drink per day (or as directed by the nutritionist); some examples of protein shakes are Syntrax Nectar, Adkins Advantage, EAS Edge HP, and Unjury. - See handout from pre-op Bariatric Education Class: °o Slowly increase the amount of protein shake you drink as tolerated °o You may find it easier to slowly sip shakes throughout the day °o It is important to get your proteins in first °• Your fluid goal is to drink 64-100 ounces of fluid daily °o It may take a few weeks to build up to this  °• 32 oz. (or more) should be clear liquids °And °• 32 oz. (or more) should be full liquids (see below for examples) °• Liquids should not contain sugar, caffeine, or carbonation ° °Clear Liquids: °• Water of Sugar-free flavored water (i.e. Fruit H²O, Propel) °• Decaffeinated coffee or tea (sugar-free) °• Crystal lite, Wyler’s Lite, Minute Maid Lite °• Sugar-free Jell-O °• Bouillon or broth °• Sugar-free Popsicle:    - Less than 20 calories  each; Limit 1 per day ° °Full Liquids: °                  Protein Shakes/Drinks + 2 choices per day of other full liquids °• Full liquids must be: °o No More Than 12 grams of Carbs per serving °o No More Than 3 grams of Fat per serving °• Strained low-fat cream soup °• Non-Fat milk °• Fat-free Lactaid Milk °• Sugar-free yogurt (Dannon Lite & Fit, Greek yogurt) ° °  °Vitamins and Minerals • Start 1 day after surgery unless otherwise directed by your surgeon °• 2 Chewable Bariatric Multivitamin / Multimineral Supplement with iron °• Chewable Calcium Citrate with Vitamin D-3 °(Example: 3 Chewable Calcium  Plus 600 with Vitamin D-3) °o Take 500 mg three (3) times a day for a total of 1500 mg each day °o Do not take all 3 doses of calcium   at one time as it may cause constipation, and you can only absorb 500 mg at a time °o Do not mix multivitamins containing iron with calcium supplements;  take 2 hours apart °• Menstruating women and those at risk for anemia ( a blood disease that causes weakness) may need extra iron °o Talk to your doctor to see if you need more iron °• If you need extra iron: Total daily Iron recommendation (including Vitamins) is 50 to 100 mg Iron/day °• Do not stop taking or change any vitamins or minerals until you talk to your nutritionist or surgeon °• Your nutritionist and/or surgeon must approve all vitamin and mineral supplements °  °Activity and Exercise: It is important to continue walking at home. Limit your physical activity as instructed by your doctor. During this time, use these guidelines: °• Do not lift anything greater than ten  (10) pounds for at least two (2) weeks °• Do not go back to work or drive until your surgeon says you can °• You may have sex when you feel comfortable °o It is VERY important for female patients to use a reliable birth control method; fertility often increase after surgery °o Do not get pregnant for at least 18 months °• Start exercising as soon as your  doctor tells you that you can °o Make sure your doctor approves any physical activity °• Start with a simple walking program °• Walk 5-15 minutes each day, 7 days per week °• Slowly increase until you are walking 30-45 minutes per day °• Consider joining our BELT program. [phone] or email [email or web address] °  °Special Instructions Things to remember: °• Use your CPAP when sleeping if this applies to you °• Consider buying a medical alert bracelet that says you had lap-band surgery °  °  You will likely have your first fill (fluid added to your band) 6 - 8 weeks after surgery °• [facility] has a free Bariatric Surgery Support Group that meets monthly, the 3rd Thursday, 6pm. [facility] [facility] Classrooms. You can see classes online at [email or web address] °• It is very important to keep all follow up appointments with your surgeon, nutritionist, primary care physician, and behavioral health practitioner °o After the first year, please follow up with your bariatric surgeon and nutritionist at least once a year in order to maintain best weight loss results °      °             [facility]:  [phone] ° °             [facility]: [phone] ° °             Bariatric Nurse Coordinator: [phone]  °Gastric Bypass/Sleeve Home Care Instructions  Rev. 09/2012    ° °                                                    Reviewed and Endorsed °                                                   by [place] Patient Education Committee, Jan, 2014 ° ° ° ° ° ° ° ° ° °

## 2017-04-03 NOTE — Transfer of Care (Signed)
Immediate Anesthesia Transfer of Care Note  Patient: Sydney Hamilton  Procedure(s) Performed: Procedure(s): LAPAROSCOPIC GASTRIC SLEEVE RESECTION, UPPER ENDO (N/A)  Patient Location: PACU  Anesthesia Type:General  Level of Consciousness: awake, alert , oriented and patient cooperative  Airway & Oxygen Therapy: Patient Spontanous Breathing and Patient connected to face mask oxygen  Post-op Assessment: Report given to RN, Post -op Vital signs reviewed and stable and Patient moving all extremities X 4  Post vital signs: stable  Last Vitals:  Vitals:   04/03/17 1658 04/03/17 1700  BP: (!) 141/104 (!) 143/58  Pulse: 83 (!) 52  Resp: (P) 20 (!) 22  Temp: (P) 36.5 C     Last Pain:  Vitals:   04/03/17 1217  TempSrc: Oral      Patients Stated Pain Goal: 4 (57/97/28 2060)  Complications: No apparent anesthesia complications

## 2017-04-03 NOTE — Interval H&P Note (Signed)
History and Physical Interval Note:  04/03/2017 2:12 PM  Sydney Hamilton  has presented today for surgery, with the diagnosis of morbid obesity, HTN, OSA  The various methods of treatment have been discussed with the patient and family. After consideration of risks, benefits and other options for treatment, the patient has consented to  Procedure(s): LAPAROSCOPIC GASTRIC SLEEVE RESECTION, UPPER ENDO (N/A) as a surgical intervention .  The patient's history has been reviewed, patient examined, no change in status, stable for surgery.  I have reviewed the patient's chart and labs.  Questions were answered to the patient's satisfaction.     Sydney Hamilton

## 2017-04-03 NOTE — Anesthesia Procedure Notes (Signed)
Procedure Name: Intubation Date/Time: 04/03/2017 2:56 PM Performed by: Lissa Morales Pre-anesthesia Checklist: Patient identified, Emergency Drugs available, Suction available and Patient being monitored Patient Re-evaluated:Patient Re-evaluated prior to induction Oxygen Delivery Method: Circle system utilized Preoxygenation: Pre-oxygenation with 100% oxygen Induction Type: IV induction Ventilation: Mask ventilation without difficulty Laryngoscope Size: Mac, 4 and Glidescope Grade View: Grade III Tube type: Oral Number of attempts: 2 Airway Equipment and Method: Stylet,  Oral airway and Video-laryngoscopy Placement Confirmation: ETT inserted through vocal cords under direct vision,  positive ETCO2 and breath sounds checked- equal and bilateral Secured at: 22 cm Tube secured with: Tape Dental Injury: Teeth and Oropharynx as per pre-operative assessment  Difficulty Due To: Difficulty was anticipated, Difficult Airway- due to limited oral opening and Difficult Airway- due to large tongue Comments: Unable to visualize cords with MAC 4, needed to use glidescope tp visualize cords, tonsils enlarged, ;arge tongue small mouth.

## 2017-04-03 NOTE — Anesthesia Preprocedure Evaluation (Signed)
Anesthesia Evaluation  Patient identified by MRN, date of birth, ID band Patient awake    Reviewed: Allergy & Precautions, NPO status , Patient's Chart, lab work & pertinent test results  Airway Mallampati: II  TM Distance: <3 FB Neck ROM: Full    Dental no notable dental hx.    Pulmonary sleep apnea and Continuous Positive Airway Pressure Ventilation ,    breath sounds clear to auscultation + decreased breath sounds      Cardiovascular hypertension, negative cardio ROS Normal cardiovascular exam Rhythm:Regular Rate:Normal     Neuro/Psych negative neurological ROS  negative psych ROS   GI/Hepatic Neg liver ROS, GERD  ,  Endo/Other  Morbid obesity  Renal/GU negative Renal ROS  negative genitourinary   Musculoskeletal negative musculoskeletal ROS (+)   Abdominal   Peds negative pediatric ROS (+)  Hematology negative hematology ROS (+)   Anesthesia Other Findings   Reproductive/Obstetrics negative OB ROS                             Anesthesia Physical Anesthesia Plan  ASA: III  Anesthesia Plan: General   Post-op Pain Management:    Induction: Intravenous  PONV Risk Score and Plan: 3 and Ondansetron, Dexamethasone and Midazolam  Airway Management Planned: Oral ETT  Additional Equipment:   Intra-op Plan:   Post-operative Plan: Extubation in OR  Informed Consent: I have reviewed the patients History and Physical, chart, labs and discussed the procedure including the risks, benefits and alternatives for the proposed anesthesia with the patient or authorized representative who has indicated his/her understanding and acceptance.   Dental advisory given  Plan Discussed with: CRNA and Surgeon  Anesthesia Plan Comments:         Anesthesia Quick Evaluation

## 2017-04-03 NOTE — Interval H&P Note (Signed)
History and Physical Interval Note:  04/03/2017 2:16 PM  Sydney Hamilton  has presented today for surgery, with the diagnosis of morbid obesity, HTN, OSA  The various methods of treatment have been discussed with the patient and family. After consideration of risks, benefits and other options for treatment, the patient has consented to  Procedure(s): LAPAROSCOPIC GASTRIC SLEEVE RESECTION, UPPER ENDO (N/A) as a surgical intervention .  The patient's history has been reviewed, patient examined, no change in status, stable for surgery.  I have reviewed the patient's chart and labs.  Questions were answered to the patient's satisfaction.     Aileen Amore B

## 2017-04-03 NOTE — Anesthesia Postprocedure Evaluation (Signed)
Anesthesia Post Note  Patient: Sydney Hamilton  Procedure(s) Performed: Procedure(s) (LRB): LAPAROSCOPIC GASTRIC SLEEVE RESECTION, UPPER ENDO (N/A)     Patient location during evaluation: PACU Anesthesia Type: General Level of consciousness: awake and alert Pain management: pain level controlled Vital Signs Assessment: post-procedure vital signs reviewed and stable Respiratory status: spontaneous breathing, nonlabored ventilation and respiratory function stable Cardiovascular status: blood pressure returned to baseline and stable Postop Assessment: no signs of nausea or vomiting Anesthetic complications: no    Last Vitals:  Vitals:   04/03/17 1800 04/03/17 1815  BP: (!) 163/91 138/82  Pulse: 67 71  Resp: (!) 21 18  Temp: 36.5 C 36.6 C    Last Pain:  Vitals:   04/03/17 1830  TempSrc:   PainSc: Asleep                 Catalina Gravel

## 2017-04-03 NOTE — H&P (View-Only) (Signed)
Chief Complaint:  Morbid obesity for sleeve gastrectomy   History of Present Illness:  Sydney Hamilton is an 41 y.o. female who works as a Building control surveyor at [employer] and who has a BMI of 51 and who wants a sleeve gastrectomy.  Informed consent performed.  She had upper endo by Dr. Darnell Level on prior workup and he mentioned a hiatal hernia but on recent UGI they saw no hiatal hernia.  She does have symptomatic GER so we will look for a hiatal hernia to fix.    Past Medical History:  Diagnosis Date  . Acne    Dr. Allyson Sabal  . Anemia    history of due to fibroids  . Asthma    allergy induced  . Fibroids    Dr. Willis Modena  . GERD (gastroesophageal reflux disease)   . History of hiatal hernia   . Hypertension   . Morbid obesity ([place])   . Sleep apnea    uses CPAP    Past Surgical History:  Procedure Laterality Date  . LAPAROSCOPIC HYSTERECTOMY N/A 03/03/2015   Procedure: HYSTERECTOMY TOTAL LAPAROSCOPIC;  Surgeon: Cheri Fowler, MD;  Location: [facility];  Service: Gynecology;  Laterality: N/A;  . REPAIR VAGINAL CUFF N/A 03/03/2015   Procedure: REPAIR VAGINAL CUFF;  Surgeon: Cheri Fowler, MD;  Location: [facility];  Service: Gynecology;  Laterality: N/A;  . WISDOM TOOTH EXTRACTION      Current Outpatient Prescriptions  Medication Sig Dispense Refill  . albuterol (PROVENTIL HFA;VENTOLIN HFA) 108 (90 BASE) MCG/ACT inhaler Inhale 2 puffs into the lungs every 6 (six) hours as needed for wheezing or shortness of breath.    Marland Kitchen amLODipine (NORVASC) 5 MG tablet Take 5 mg by mouth daily. Amlodipine Besylate 5 MG Tablet 1 tablet Once a day    . doxycycline (VIBRAMYCIN) 100 MG capsule Take 100 mg by mouth daily as needed (for breakouts).    . hydrochlorothiazide (HYDRODIURIL) 25 MG tablet Take 25 mg by mouth daily. Hydrochlorothiazide 25 MG Tablet 1 tablet Once a day    . ibuprofen (ADVIL,MOTRIN) 200 MG tablet Take 800 mg by mouth every 6 (six) hours as needed for moderate pain.    Marland Kitchen ibuprofen (ADVIL,MOTRIN)  600 MG tablet Take 1 tablet (600 mg total) by mouth every 6 (six) hours as needed (mild pain). 30 tablet 0  . oxyCODONE-acetaminophen (PERCOCET/ROXICET) 5-325 MG per tablet Take 1-2 tablets by mouth every 4 (four) hours as needed for severe pain (moderate to severe pain (when tolerating fluids)). 30 tablet 0  . potassium chloride SA (K-DUR,KLOR-CON) 20 MEQ tablet Take 20 mEq by mouth 2 (two) times daily.    . valACYclovir (VALTREX) 1000 MG tablet Take 500 mg by mouth daily as needed (for outbreaks).     No current facility-administered medications for this visit.    Lisinopril No family history on file. Social History:   reports that she has never smoked. She does not have any smokeless tobacco history on file. She reports that she does not drink alcohol or use drugs.   REVIEW OF SYSTEMS : Negative except for see problem list  Physical Exam:   Last menstrual period 02/21/2015. There is no height or weight on file to calculate BMI.  Gen:  WDWN AAF NAD  Neurological: Alert and oriented to person, place, and time. Motor and sensory function is grossly intact  Head: Normocephalic and atraumatic.  Eyes: Conjunctivae are normal. Pupils are equal, round, and reactive to light. No scleral icterus.  Neck: Normal range  of motion. Neck supple. No tracheal deviation or thyromegaly present.  Cardiovascular:  SR without murmurs or gallops.  No carotid bruits Breast:  Not examined Respiratory: Effort normal.  No respiratory distress. No chest wall tenderness. Breath sounds normal.  No wheezes, rales or rhonchi.  Abdomen:  Obese and nontender GU:  Not examined Musculoskeletal: Normal range of motion. Extremities are nontender. No cyanosis, edema or clubbing noted Lymphadenopathy: No cervical, preauricular, postauricular or axillary adenopathy is present Skin: Skin is warm and dry. No rash noted. No diaphoresis. No erythema. No pallor. Pscyh: Normal mood and affect. Behavior is normal. Judgment and  thought content normal.   LABORATORY RESULTS: No results found for this or any previous visit (from the past 48 hour(s)).   RADIOLOGY RESULTS: No results found.  Problem List: Patient Active Problem List   Diagnosis Date Noted  . S/P hysterectomy with oophorectomy 03/03/2015    Assessment & Plan: Morbid obesity BMI 51, hx of GER --for lap sleeve gastrectomy and possible repair of hiatal hernia    Matt B. Hassell Done, MD, [facility] [phone] beeper [phone]  03/08/2017 11:21 AM

## 2017-04-03 NOTE — Op Note (Signed)
Surgeon: Kaylyn Lim, MD, FACS   Asst:  Gurney Maxin, MD  Anes:  General endotracheal  Procedure: Laparoscopic sleeve gastrectomy and upper endoscopy  Diagnosis: Morbid obesity  Complications: none  EBL:   5 cc  Description of Procedure:  The patient was take to OR 2 and given general anesthesia.  The abdomen was prepped with Technicare and draped sterilely.  A timeout was performed.  Access to the abdomen was achieved with a 5 mm Optiview through the left upper quadrant.  Following insufflation, the state of the abdomen was found to be free of adhesions.  Because of her reflux symptoms, I dissected her EG junction and repaired a small posterior hiatal hernia with a single posterior suture secured with a Ty Knot.    There is a lower midline hernia that was not posing any immediate threats.  The ViSiGi 36Fr tube was inserted to deflate the stomach and was pulled back into the esophagus.    The pylorus was identified and we measured 5 cm back and marked the antrum.  At that point we began dissection to take down the greater curvature of the stomach using the Harmonic scalpel.  This dissection was taken all the way up to the left crus.  Posterior attachments of the stomach were also taken down.    The ViSiGi tube was then passed into the antrum and suction applied so that it was snug along the lessor curvature.  The "crow's foot" or incisura was identified.  The sleeve gastrectomy was begun using the [employer] stapler beginning with a 4.5 cm black load with TRS.  This is followed by a 6 cm black load with TRS.  When the sleeve was complete the tube was taken off suction and insufflated briefly.  The tube was withdrawn.  Upper endoscopy was then performed by Dr. Kieth Brightly.     The specimen was extracted through the 15 trocar site which was closed with a 0 vicryl using the Endoclose.  Wounds were infiltrated with Exparel and closed with 4-0 Monocryl and Dermabond.    Matt B. Hassell Done,  [place], [facility], [place]

## 2017-04-03 NOTE — Op Note (Signed)
Preoperative diagnosis: laparoscopic sleeve gastrectomy  Postoperative diagnosis: Same   Procedure: Upper endoscopy   Surgeon: Bijou Easler, M.D.  Anesthesia: Gen.   Indications for procedure: This patient was undergoing a laparoscopic sleeve gastrectomy.   Description of procedure: The endoscopy was placed in the mouth and into the oropharynx and under endoscopic vision it was advanced to the esophagogastric junction. The pouch was insufflated and no bleeding or bubbles were seen. The GEJ was identified at 42cm from the teeth. No bleeding or leaks were detected. The scope was withdrawn without difficulty.   Nikita Surman, M.D. General, Bariatric, & Minimally Invasive Surgery [facility]    

## 2017-04-04 ENCOUNTER — Encounter (HOSPITAL_COMMUNITY): Payer: Self-pay | Admitting: Surgery

## 2017-04-04 ENCOUNTER — Encounter (HOSPITAL_COMMUNITY): Payer: Self-pay

## 2017-04-04 LAB — CBC WITH DIFFERENTIAL/PLATELET
Basophils Absolute: 0 10*3/uL (ref 0.0–0.1)
Basophils Relative: 0 %
EOS PCT: 0 %
Eosinophils Absolute: 0 10*3/uL (ref 0.0–0.7)
HCT: 35.7 % — ABNORMAL LOW (ref 36.0–46.0)
HEMOGLOBIN: 11.9 g/dL — AB (ref 12.0–15.0)
LYMPHS ABS: 0.8 10*3/uL (ref 0.7–4.0)
LYMPHS PCT: 10 %
MCH: 27.6 pg (ref 26.0–34.0)
MCHC: 33.3 g/dL (ref 30.0–36.0)
MCV: 82.8 fL (ref 78.0–100.0)
Monocytes Absolute: 0.4 10*3/uL (ref 0.1–1.0)
Monocytes Relative: 5 %
Neutro Abs: 7.2 10*3/uL (ref 1.7–7.7)
Neutrophils Relative %: 85 %
Platelets: 196 10*3/uL (ref 150–400)
RBC: 4.31 MIL/uL (ref 3.87–5.11)
RDW: 14.2 % (ref 11.5–15.5)
WBC: 8.5 10*3/uL (ref 4.0–10.5)

## 2017-04-04 NOTE — Progress Notes (Signed)
Note provided for spouse for work due to patient surgery and subsequent hospital stay.

## 2017-04-04 NOTE — Progress Notes (Signed)
Patient ID: Sydney Hamilton, female   DOB: March 04, 1976, 41 y.o.   MRN: [id number] [facility] Progress Note:   1 Day Post-Op  Subjective: Mental status is clear.   Objective: Vital signs in last 24 hours: Temp:  [97.6 F (36.4 C)-98.7 F (37.1 C)] 98.7 F (37.1 C) (08/08 0532) Pulse Rate:  [52-102] 102 (08/08 0532) Resp:  [14-24] 18 (08/08 0532) BP: (114-165)/(58-104) 141/91 (08/08 0532) SpO2:  [88 %-100 %] 99 % (08/08 0532) Weight:  [136.5 kg (301 lb)-136.9 kg (301 lb 12.8 oz)] 136.5 kg (301 lb) (08/07 1231)  Intake/Output from previous day: 08/07 0701 - 08/08 0700 In: 3058.3 [P.O.:180; I.V.:2878.3] Out: 1300 [Urine:1250; Blood:50] Intake/Output this shift: No intake/output data recorded.  Physical Exam: Work of breathing is normal.  Has sore throat - not bad...related to intubation and EGD  Lab Results:  Results for orders placed or performed during the hospital encounter of 04/03/17 (from the past 48 hour(s))  CBC     Status: Abnormal   Collection Time: 04/03/17  7:16 PM  Result Value Ref Range   WBC 11.4 (H) 4.0 - 10.5 K/uL   RBC 4.49 3.87 - 5.11 MIL/uL   Hemoglobin 12.9 12.0 - 15.0 g/dL   HCT 37.9 36.0 - 46.0 %   MCV 84.4 78.0 - 100.0 fL   MCH 28.7 26.0 - 34.0 pg   MCHC 34.0 30.0 - 36.0 g/dL   RDW 14.2 11.5 - 15.5 %   Platelets 192 150 - 400 K/uL  Creatinine, serum     Status: None   Collection Time: 04/03/17  7:16 PM  Result Value Ref Range   Creatinine, Ser 0.90 0.44 - 1.00 mg/dL   GFR calc non Af Amer >60 >60 mL/min   GFR calc Af Amer >60 >60 mL/min    Comment: (NOTE) The eGFR has been calculated using the CKD EPI equation. This calculation has not been validated in all clinical situations. eGFR's persistently <60 mL/min signify possible Chronic Kidney Disease.   CBC WITH DIFFERENTIAL     Status: Abnormal   Collection Time: 04/04/17  5:27 AM  Result Value Ref Range   WBC 8.5 4.0 - 10.5 K/uL   RBC 4.31 3.87 - 5.11 MIL/uL   Hemoglobin 11.9 (L) 12.0  - 15.0 g/dL   HCT 35.7 (L) 36.0 - 46.0 %   MCV 82.8 78.0 - 100.0 fL   MCH 27.6 26.0 - 34.0 pg   MCHC 33.3 30.0 - 36.0 g/dL   RDW 14.2 11.5 - 15.5 %   Platelets 196 150 - 400 K/uL   Neutrophils Relative % 85 %   Neutro Abs 7.2 1.7 - 7.7 K/uL   Lymphocytes Relative 10 %   Lymphs Abs 0.8 0.7 - 4.0 K/uL   Monocytes Relative 5 %   Monocytes Absolute 0.4 0.1 - 1.0 K/uL   Eosinophils Relative 0 %   Eosinophils Absolute 0.0 0.0 - 0.7 K/uL   Basophils Relative 0 %   Basophils Absolute 0.0 0.0 - 0.1 K/uL    Radiology/Results: No results found.  Anti-infectives: Anti-infectives    Start     Dose/Rate Route Frequency Ordered Stop   04/04/17 0600  cefoTEtan in Dextrose 5% (CEFOTAN) IVPB 2 g  Status:  Discontinued     2 g Intravenous On call to O.R. 04/03/17 1213 04/03/17 1215   04/03/17 1230  cefoTEtan in Dextrose 5% (CEFOTAN) IVPB 2 g     2 g Intravenous On call to O.R. 04/03/17 1215 04/03/17 1510  Assessment/Plan: Problem List: Patient Active Problem List   Diagnosis Date Noted  . 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 S/P hysterectomy with oophorectomy 03/03/2015    Doing well.  Possible discharge later today.   1 Day Post-Op    LOS: 1 day   Matt B. Hassell Done, MD, [facility] [phone] beeper [phone]  04/04/2017 8:35 AM

## 2017-04-04 NOTE — Progress Notes (Signed)
RT set up CPAP for patient . Patient will self administer when ready

## 2017-04-04 NOTE — Progress Notes (Signed)
Patient alert and oriented, pain is controlled. Patient is tolerating fluids, advanced to protein shake today, patient is tolerating well.  Reviewed Gastric sleeve discharge instructions with patient and patient is able to articulate understanding.  Provided information on BELT program, Support Group and [facility] outpatient pharmacy. All questions answered, will continue to monitor.  

## 2017-04-04 NOTE — Discharge Summary (Signed)
Physician Discharge Summary  Patient ID: Sydney Hamilton MRN: [id number] DOB/AGE: November 16, 1975 41 y.o.  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  Diet advanced on Wednesday and ready for discharge  Consults: none  Significant Diagnostic Studies: none    Discharge Exam: Blood pressure 118/65, pulse 91, temperature 98.6 F (37 C), temperature source Oral, resp. rate 18, height 5\' 6"  (1.676 m), weight (!) 136.5 kg (301 lb), last menstrual period 02/21/2015, SpO2 99 %. Incisions OK  Disposition: 01-Home or Self Care  Discharge Instructions    Ambulate hourly while awake    Complete by:  As directed    Call MD for:  difficulty breathing, headache or visual disturbances    Complete by:  As directed    Call MD for:  persistant dizziness or light-headedness    Complete by:  As directed    Call MD for:  persistant nausea and vomiting    Complete by:  As directed    Call MD for:  redness, tenderness, or signs of infection (pain, swelling, redness, odor or green/yellow discharge around incision site)    Complete by:  As directed    Call MD for:  severe uncontrolled pain    Complete by:  As directed    Call MD for:  temperature >101 F    Complete by:  As directed    Diet bariatric full liquid    Complete by:  As directed    Incentive spirometry    Complete by:  As directed    Perform hourly while awake     Allergies as of 04/04/2017      Reactions   Lisinopril Swelling, Anaphylaxis   On the face Lips and face swell      Medication List    TAKE these medications   acetaminophen 500 MG tablet Commonly known as:  TYLENOL Take 1,000 mg by mouth every 8 (eight) hours as needed for headache.   albuterol 108  (90 Base) MCG/ACT inhaler Commonly known as:  PROVENTIL HFA;VENTOLIN HFA Inhale 2 puffs into the lungs every 6 (six) hours as needed for wheezing or shortness of breath.   amLODipine 5 MG tablet Commonly known as:  NORVASC Take 5 mg by mouth daily. Amlodipine Besylate 5 MG Tablet 1 tablet Once a day Notes to patient:  Monitor Blood Pressure Daily and keep a log for primary care physician.  You may need to make changes to your medications with rapid weight loss.     BEANO PO Take 1 capsule by mouth daily as needed.   diphenhydrAMINE 25 mg capsule Commonly known as:  BENADRYL Take 25 mg by mouth daily as needed for allergies.   doxycycline 100 MG capsule Commonly known as:  VIBRAMYCIN Take 100 mg by mouth daily as needed (for acne breakouts).   famotidine 20 MG tablet Commonly known as:  PEPCID Take 20 mg by mouth daily as needed for heartburn or indigestion.   fexofenadine 180 MG tablet Commonly known as:  ALLEGRA Take 180 mg by mouth daily.   hydrochlorothiazide 25 MG tablet Commonly known as:  HYDRODIURIL Take 25 mg by mouth daily. Hydrochlorothiazide 25 MG Tablet 1 tablet Once a day Notes to patient:  Monitor Blood Pressure Daily and keep a log  for primary care physician.  Monitor for symptoms of dehydration.  You may need to make changes to your medications with rapid weight loss.     ibuprofen 200 MG tablet Commonly known as:  ADVIL,MOTRIN Take 800 mg by mouth every 6 (six) hours as needed for moderate pain. Notes to patient:  Avoid NSAIDs for 6-8 weeks after surgery   mometasone-formoterol 100-5 MCG/ACT Aero Commonly known as:  DULERA Inhale 2 puffs into the lungs 2 (two) times daily as needed for wheezing or shortness of breath.   multivitamin with minerals tablet Take 1 tablet by mouth daily.   potassium chloride SA 20 MEQ tablet Commonly known as:  K-DUR,KLOR-CON Take 20 mEq by mouth 2 (two) times daily.   SALONPAS PAIN RELIEF PATCH EX Place 1 patch onto the  skin daily as needed (pain).   Simethicone 180 MG Caps Take 1 capsule by mouth daily as needed.   valACYclovir 1000 MG tablet Commonly known as:  VALTREX Take 500 mg by mouth daily as needed (for outbreaks).      Follow-up Information    Johnathan Hausen, MD. Go on 05/04/2017.   Specialty:  General Surgery Why:  at 2 pm Contact information: [address] [place] [address] [phone]           Signed: Pedro Earls 04/04/2017, 6:04 PM

## 2017-04-12 ENCOUNTER — Telehealth (HOSPITAL_COMMUNITY): Payer: Self-pay

## 2017-04-12 NOTE — Telephone Encounter (Addendum)
Voicemail left for patient with contact information to discuss below questions post bariatric surgery.    Made discharge phone call to patient. Asking the following questions.    1. Do you have someone to care for you now that you are home? independent  2. Are you having pain now that is not relieved by your pain medication?   3. Are you able to drink the recommended daily amount of fluids (48 ounces minimum/day) and protein (60-80 grams/day) as prescribed by the dietitian or nutritional counselor?  64 ounces of fluid, 60 grams of protein 4. Are you taking the vitamins and minerals as prescribed?  Not taking vitamins suppose to get tomorrow. 5. Do you have the "on call" number to contact your surgeon if you have a problem or question?  yes 6. Are your incisions free of redness, swelling or drainage? (If steri strips, address that these can fall off, shower as tolerated) yes 7. Have your bowels moved since your surgery?  If not, are you passing gas?  yes 8. Are you up and walking 3-4 times per day?  yes 9. Were you provided your discharge medications before your surgery or before you were discharged from the hospital and are you taking them without problem?  yes

## 2017-04-17 ENCOUNTER — Encounter: Payer: BLUE CROSS/BLUE SHIELD | Attending: Surgery

## 2017-04-17 DIAGNOSIS — Z6841 Body Mass Index (BMI) 40.0 and over, adult: Secondary | ICD-10-CM | POA: Insufficient documentation

## 2017-04-17 DIAGNOSIS — Z713 Dietary counseling and surveillance: Secondary | ICD-10-CM | POA: Diagnosis present

## 2017-04-17 DIAGNOSIS — E669 Obesity, unspecified: Secondary | ICD-10-CM

## 2017-04-24 NOTE — Progress Notes (Signed)
Bariatric Class:  Appt start time: 1530 end time:  1630.  2 Week Post-Operative Nutrition Class  Patient was seen on 04/17/2017 for Post[facility]ative [facility]   Surgery date: 04/03/2017 Surgery type: sleeve [facility] weight at [facility]: 318 Weight today: 292  TANITA  BODY COMP RESULTS  04/24/2017   BMI (kg/m^2) 47.1   Fat Mass (lbs) 162.6   Fat Free Mass (lbs) 129.4   Total Body Water (lbs) 96.6   The following the learning objectives were met by the patient during this course:  Identifies Phase 3A (Soft, High Proteins) Dietary Goals and will begin from 2 weeks post-operatively to 2 months post-operatively  Identifies appropriate sources of fluids and proteins   States protein recommendations and appropriate sources post-operatively  Identifies the need for appropriate texture modifications, mastication, and bite sizes when consuming solids  Identifies appropriate multivitamin and calcium sources post-operatively  Describes the need for physical activity post-operatively and will follow MD recommendations  States when to call healthcare provider regarding medication questions or post-operative complications  Handouts given during class include:  Phase 3A: Soft, High Protein Diet Handout  Follow-Up Plan: Patient will follow-up at [facility] in 6 weeks for 2 month post-op nutrition visit for diet advancement per MD.

## 2017-05-31 ENCOUNTER — Ambulatory Visit: Payer: BLUE CROSS/BLUE SHIELD | Admitting: Skilled Nursing Facility1

## 2017-06-01 DIAGNOSIS — K30 Functional dyspepsia: Secondary | ICD-10-CM | POA: Diagnosis not present

## 2017-07-10 DIAGNOSIS — N909 Noninflammatory disorder of vulva and perineum, unspecified: Secondary | ICD-10-CM | POA: Diagnosis not present

## 2017-09-25 DIAGNOSIS — M545 Low back pain: Secondary | ICD-10-CM | POA: Diagnosis not present

## 2017-09-25 DIAGNOSIS — M7061 Trochanteric bursitis, right hip: Secondary | ICD-10-CM | POA: Diagnosis not present

## 2017-09-25 DIAGNOSIS — M7062 Trochanteric bursitis, left hip: Secondary | ICD-10-CM | POA: Diagnosis not present

## 2017-10-03 DIAGNOSIS — R062 Wheezing: Secondary | ICD-10-CM | POA: Diagnosis not present

## 2017-10-03 DIAGNOSIS — R05 Cough: Secondary | ICD-10-CM | POA: Diagnosis not present

## 2017-10-11 DIAGNOSIS — J45909 Unspecified asthma, uncomplicated: Secondary | ICD-10-CM | POA: Diagnosis not present

## 2017-10-11 DIAGNOSIS — J069 Acute upper respiratory infection, unspecified: Secondary | ICD-10-CM | POA: Diagnosis not present

## 2017-10-11 DIAGNOSIS — M795 Residual foreign body in soft tissue: Secondary | ICD-10-CM | POA: Diagnosis not present

## 2017-10-11 DIAGNOSIS — I10 Essential (primary) hypertension: Secondary | ICD-10-CM | POA: Diagnosis not present

## 2017-12-10 DIAGNOSIS — I10 Essential (primary) hypertension: Secondary | ICD-10-CM | POA: Diagnosis not present

## 2017-12-10 DIAGNOSIS — E7800 Pure hypercholesterolemia, unspecified: Secondary | ICD-10-CM | POA: Diagnosis not present

## 2017-12-10 DIAGNOSIS — M25551 Pain in right hip: Secondary | ICD-10-CM | POA: Diagnosis not present

## 2018-01-18 DIAGNOSIS — M25532 Pain in left wrist: Secondary | ICD-10-CM | POA: Diagnosis not present

## 2018-01-18 DIAGNOSIS — M25551 Pain in right hip: Secondary | ICD-10-CM | POA: Diagnosis not present

## 2018-01-18 DIAGNOSIS — M25552 Pain in left hip: Secondary | ICD-10-CM | POA: Diagnosis not present

## 2018-02-05 DIAGNOSIS — H16223 Keratoconjunctivitis sicca, not specified as Sjogren's, bilateral: Secondary | ICD-10-CM | POA: Diagnosis not present

## 2018-02-05 DIAGNOSIS — H18603 Keratoconus, unspecified, bilateral: Secondary | ICD-10-CM | POA: Diagnosis not present

## 2018-02-05 DIAGNOSIS — H5213 Myopia, bilateral: Secondary | ICD-10-CM | POA: Diagnosis not present

## 2018-02-05 DIAGNOSIS — H1189 Other specified disorders of conjunctiva: Secondary | ICD-10-CM | POA: Diagnosis not present

## 2018-02-05 DIAGNOSIS — H04123 Dry eye syndrome of bilateral lacrimal glands: Secondary | ICD-10-CM | POA: Diagnosis not present

## 2018-03-08 DIAGNOSIS — M25552 Pain in left hip: Secondary | ICD-10-CM | POA: Diagnosis not present

## 2018-03-08 DIAGNOSIS — M25551 Pain in right hip: Secondary | ICD-10-CM | POA: Diagnosis not present

## 2018-05-05 DIAGNOSIS — J019 Acute sinusitis, unspecified: Secondary | ICD-10-CM | POA: Diagnosis not present

## 2018-05-05 DIAGNOSIS — B9689 Other specified bacterial agents as the cause of diseases classified elsewhere: Secondary | ICD-10-CM | POA: Diagnosis not present

## 2018-06-14 DIAGNOSIS — Z1231 Encounter for screening mammogram for malignant neoplasm of breast: Secondary | ICD-10-CM | POA: Diagnosis not present

## 2018-06-14 DIAGNOSIS — Z6841 Body Mass Index (BMI) 40.0 and over, adult: Secondary | ICD-10-CM | POA: Diagnosis not present

## 2018-06-14 DIAGNOSIS — Z01419 Encounter for gynecological examination (general) (routine) without abnormal findings: Secondary | ICD-10-CM | POA: Diagnosis not present

## 2018-06-14 DIAGNOSIS — Z1389 Encounter for screening for other disorder: Secondary | ICD-10-CM | POA: Diagnosis not present

## 2018-06-14 DIAGNOSIS — Z130 Encounter for screening for diseases of the blood and blood-forming organs and certain disorders involving the immune mechanism: Secondary | ICD-10-CM | POA: Diagnosis not present

## 2018-06-21 ENCOUNTER — Other Ambulatory Visit: Payer: Self-pay | Admitting: Obstetrics and Gynecology

## 2018-06-21 DIAGNOSIS — R928 Other abnormal and inconclusive findings on diagnostic imaging of breast: Secondary | ICD-10-CM

## 2018-07-12 ENCOUNTER — Ambulatory Visit
Admission: RE | Admit: 2018-07-12 | Discharge: 2018-07-12 | Disposition: A | Discharge status: Home / Self Care | Payer: BLUE CROSS/BLUE SHIELD | Source: Ambulatory Visit | Attending: Obstetrics and Gynecology | Admitting: Obstetrics and Gynecology

## 2018-07-12 DIAGNOSIS — R928 Other abnormal and inconclusive findings on diagnostic imaging of breast: Secondary | ICD-10-CM

## 2018-07-12 DIAGNOSIS — N6002 Solitary cyst of left breast: Secondary | ICD-10-CM | POA: Diagnosis not present

## 2018-07-23 DIAGNOSIS — J029 Acute pharyngitis, unspecified: Secondary | ICD-10-CM | POA: Diagnosis not present

## 2018-07-23 DIAGNOSIS — B349 Viral infection, unspecified: Secondary | ICD-10-CM | POA: Diagnosis not present

## 2018-07-23 DIAGNOSIS — J45909 Unspecified asthma, uncomplicated: Secondary | ICD-10-CM | POA: Diagnosis not present

## 2018-08-29 DIAGNOSIS — J019 Acute sinusitis, unspecified: Secondary | ICD-10-CM | POA: Diagnosis not present

## 2018-09-06 DIAGNOSIS — M25552 Pain in left hip: Secondary | ICD-10-CM | POA: Diagnosis not present

## 2018-09-06 DIAGNOSIS — M7061 Trochanteric bursitis, right hip: Secondary | ICD-10-CM | POA: Diagnosis not present

## 2018-09-06 DIAGNOSIS — M25551 Pain in right hip: Secondary | ICD-10-CM | POA: Diagnosis not present

## 2019-06-20 ENCOUNTER — Encounter (HOSPITAL_COMMUNITY): Payer: Self-pay

## 2019-08-29 IMAGING — MG DIGITAL DIAGNOSTIC UNILATERAL LEFT MAMMOGRAM WITH TOMO AND CAD
4 series · 4 of 12 positions shown · non-contrast
Comparison: With priors.

CLINICAL DATA: Patient was called back from screening mammogram for
a possible mass in the left breast.

EXAM:
DIGITAL DIAGNOSTIC LEFT MAMMOGRAM WITH CAD AND TOMO
ULTRASOUND LEFT BREAST

[L CC synth-2D]
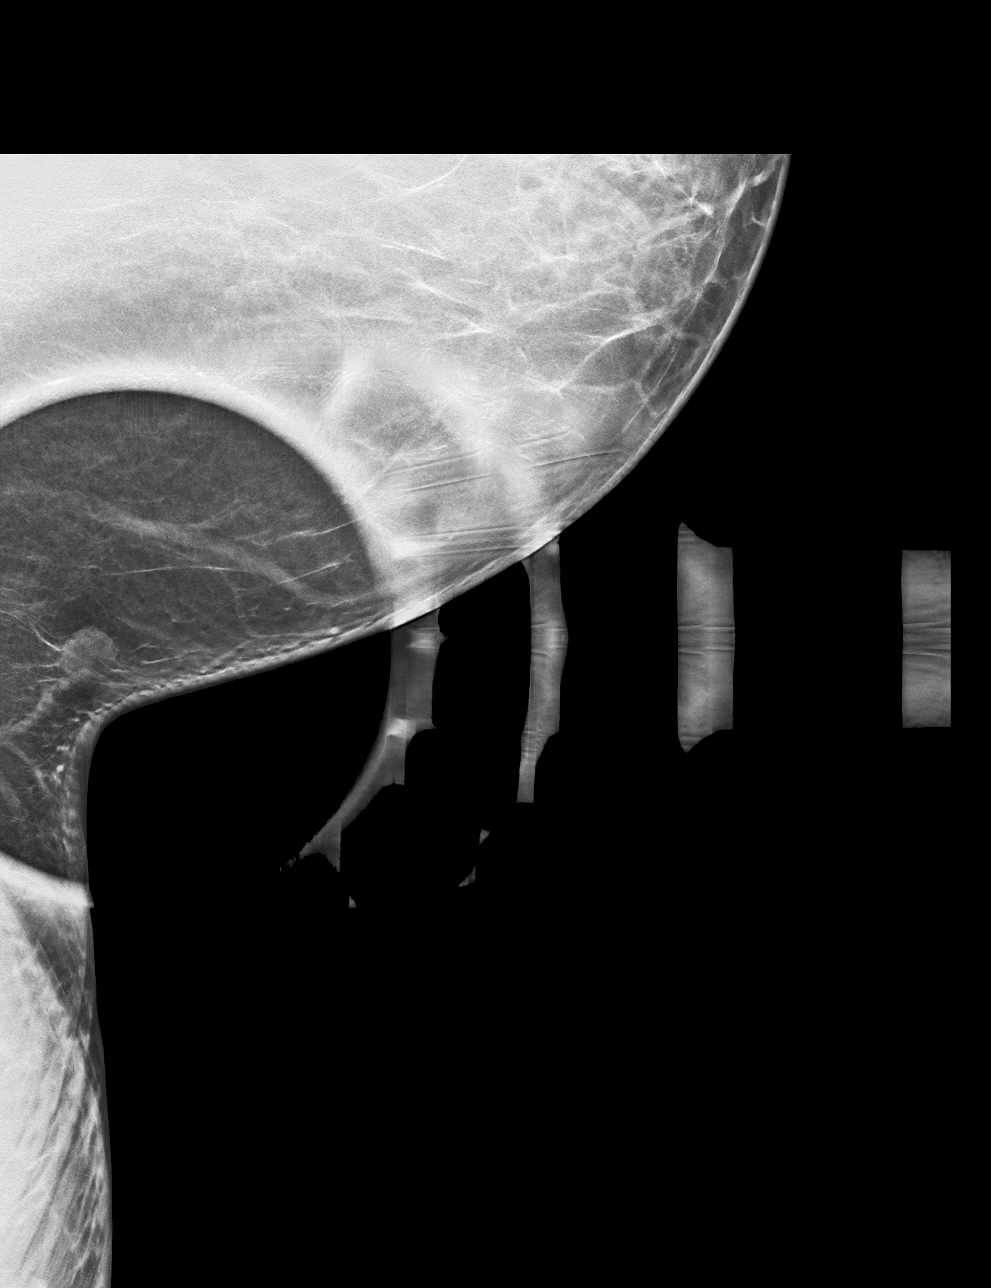

[L ML synth-2D]
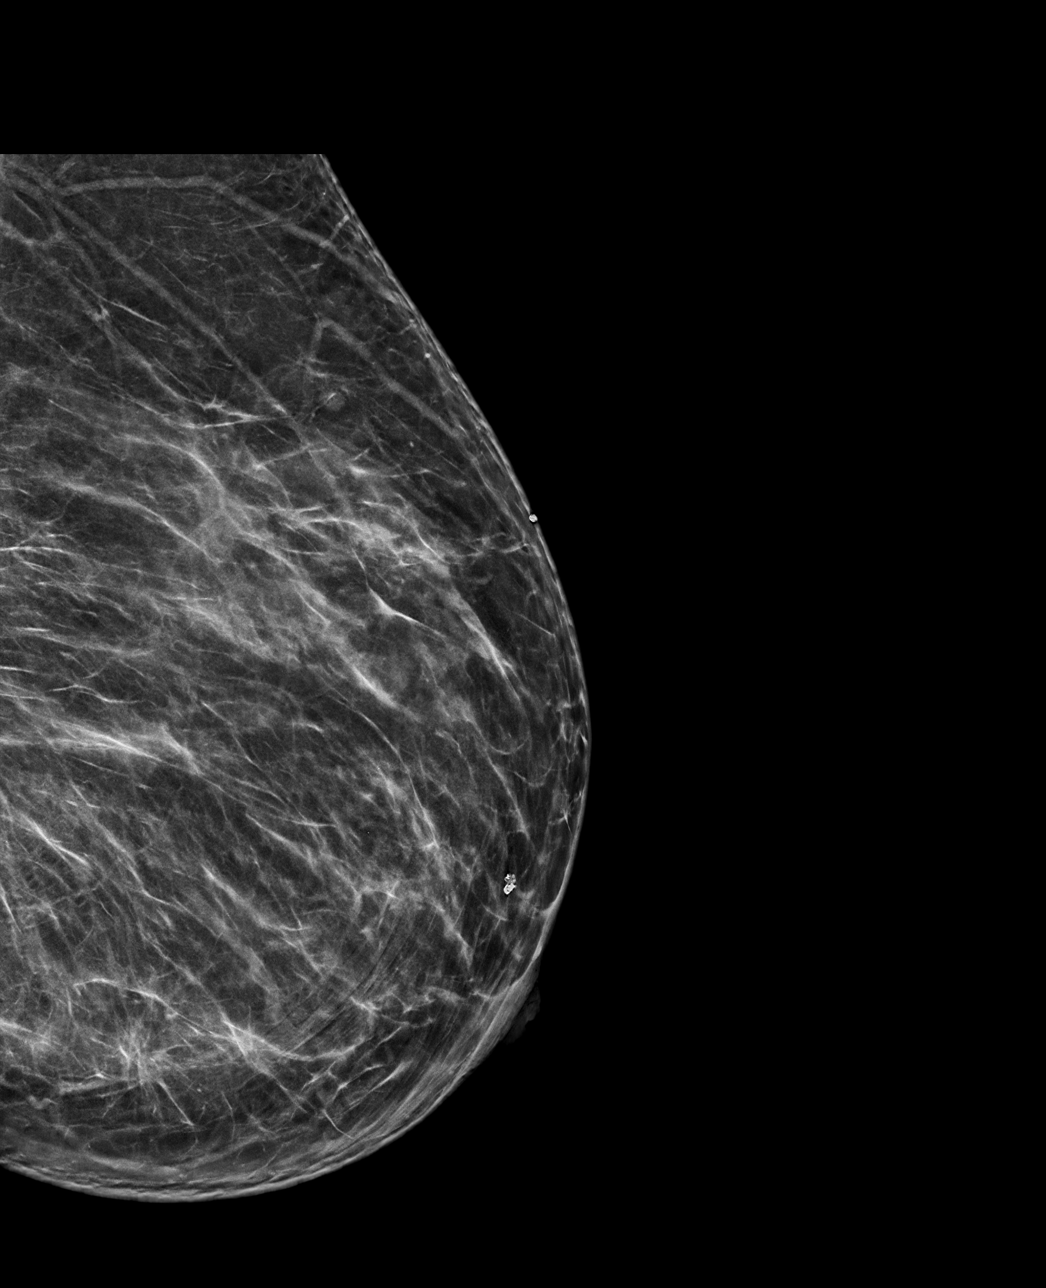

[L ML tomo · tomo slice 40/79.0]
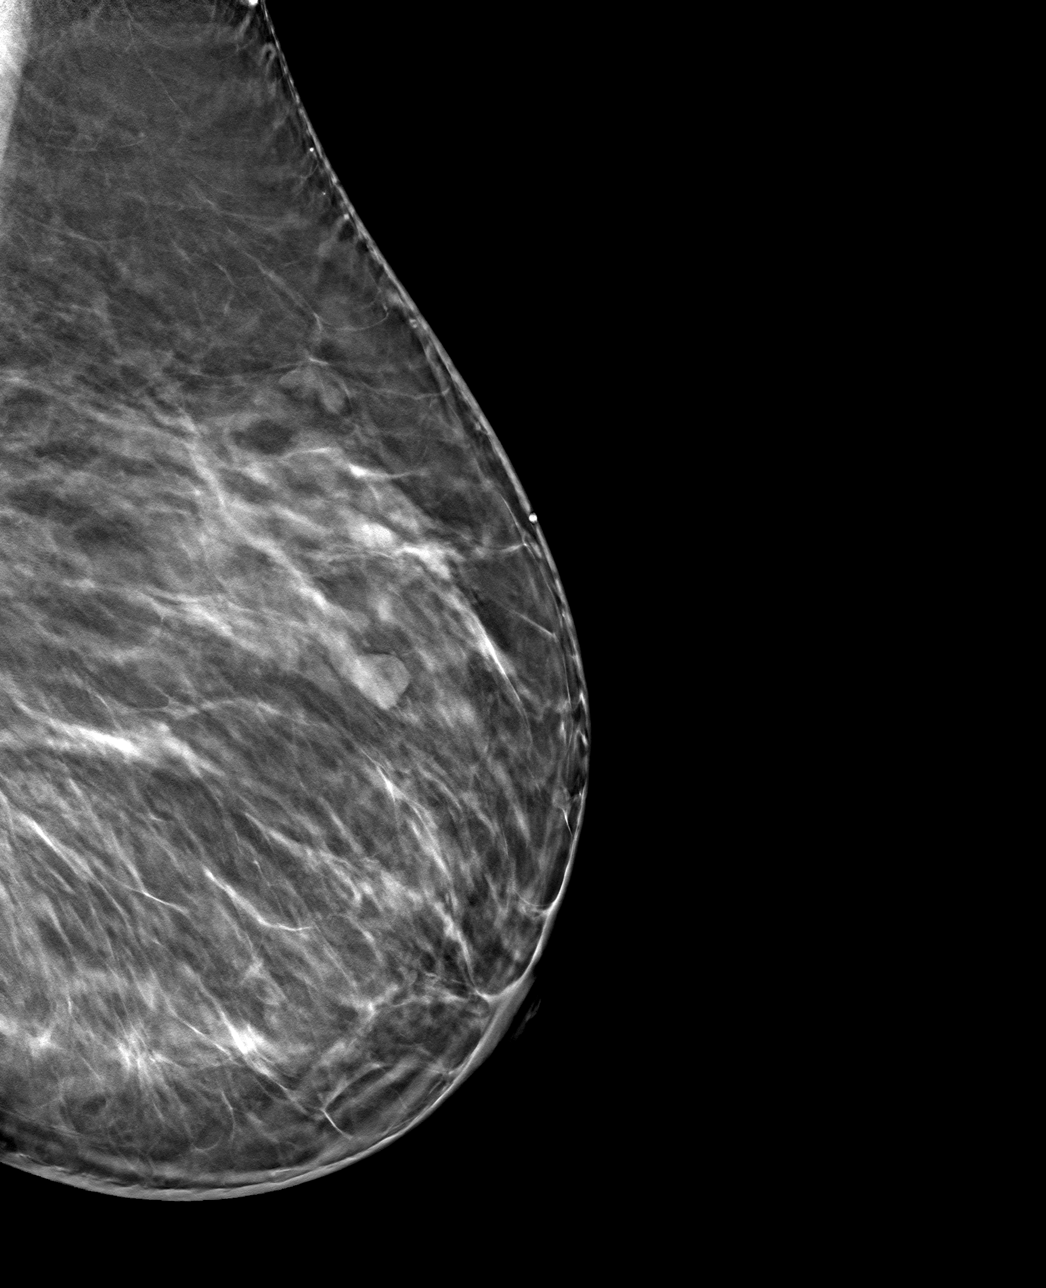

[L CC tomo · tomo slice 25/50.0]
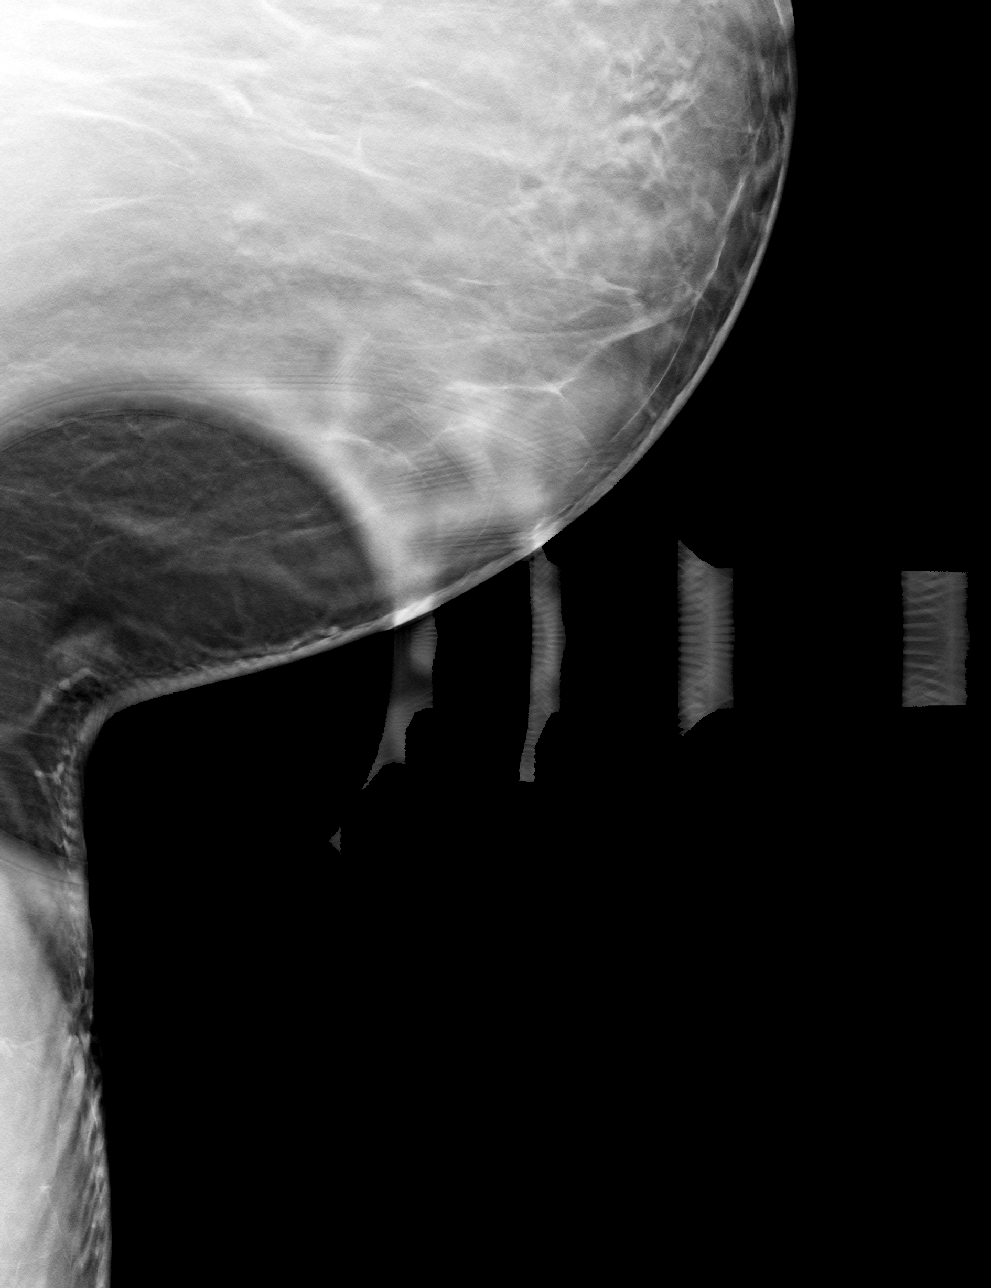

[4 of 12 positions shown; findings below may reference images not displayed]

ACR Breast Density Category b: There are scattered areas of
fibroglandular density.
FINDINGS: Additional imaging of the left breast was performed. There is a
superficial 9 mm mass in the medial aspect of the breast.

Mammographic images were processed with CAD.

On physical exam, I palpate a superficial lesion in the left breast
with a tract to the skin surface at [DATE] 14 cm from the nipple.

Targeted ultrasound is performed, showing a hypoechoic mass in the
skin of left breast at [DATE] 14 cm from the nipple with a poor
leading to the skin surface measuring 1.0 x 0.7 x 1.0 cm. It is
consistent with a benign sebaceous cyst.
IMPRESSION: Sebaceous cyst in the left breast.  No evidence of malignancy.

RECOMMENDATION:
Bilateral screening mammogram in 1 year is recommended.

I have discussed the findings and recommendations with the patient.
Results were also provided in writing at the conclusion of the
visit. If applicable, a reminder letter will be sent to the patient
regarding the next appointment.

BI-RADS CATEGORY  2: Benign.

## 2019-08-29 IMAGING — US ULTRASOUND LEFT BREAST LIMITED
1 series · 7 of 7 positions shown · non-contrast
Comparison: With priors.

CLINICAL DATA: Patient was called back from screening mammogram for
a possible mass in the left breast.

EXAM:
DIGITAL DIAGNOSTIC LEFT MAMMOGRAM WITH CAD AND TOMO
ULTRASOUND LEFT BREAST

[Series 1: ultrasound left breast limited · 0.06mm/px · 7 of 7 slices shown]
[im 1/7]
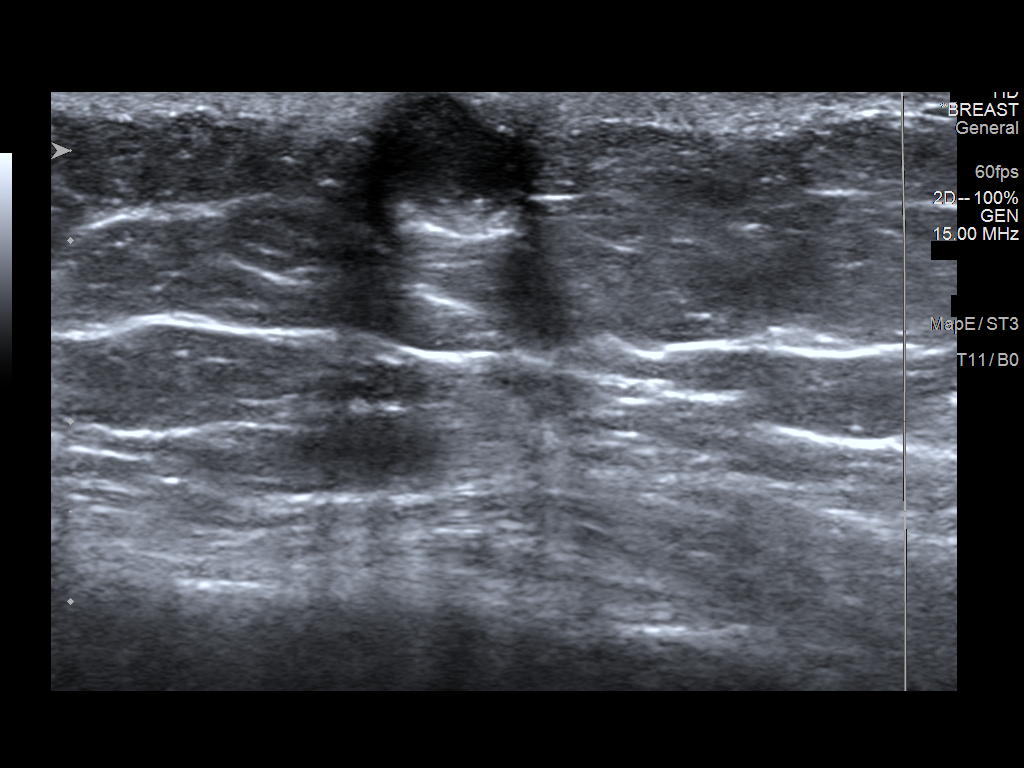
[im 2/7]
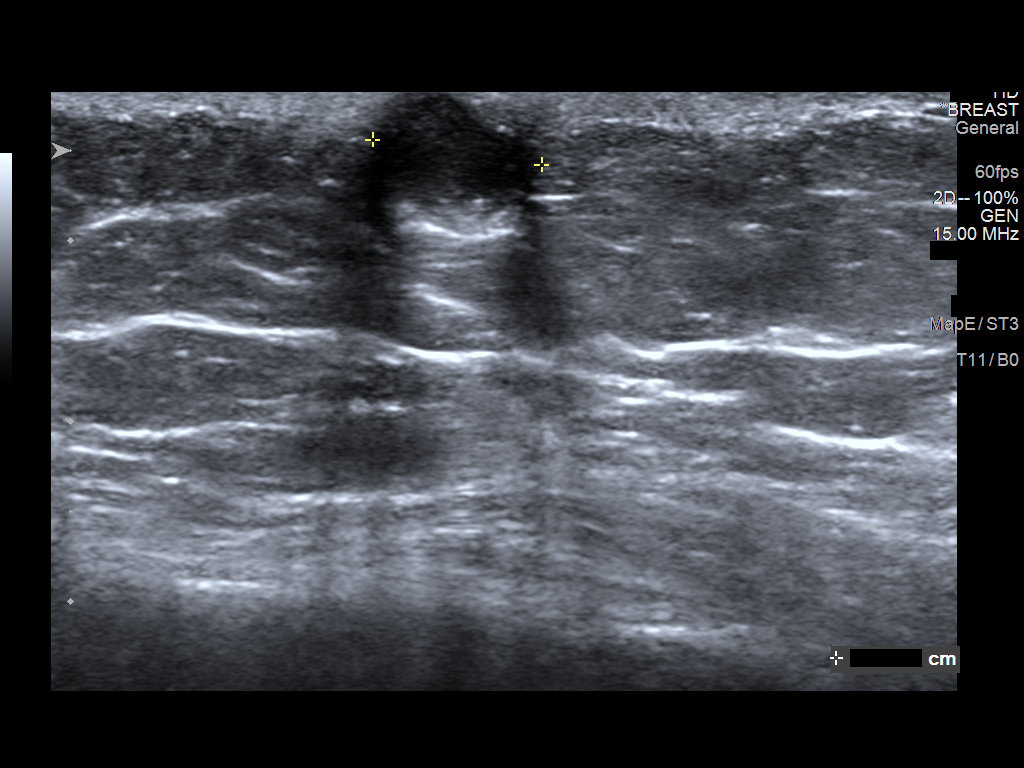
[im 3/7]
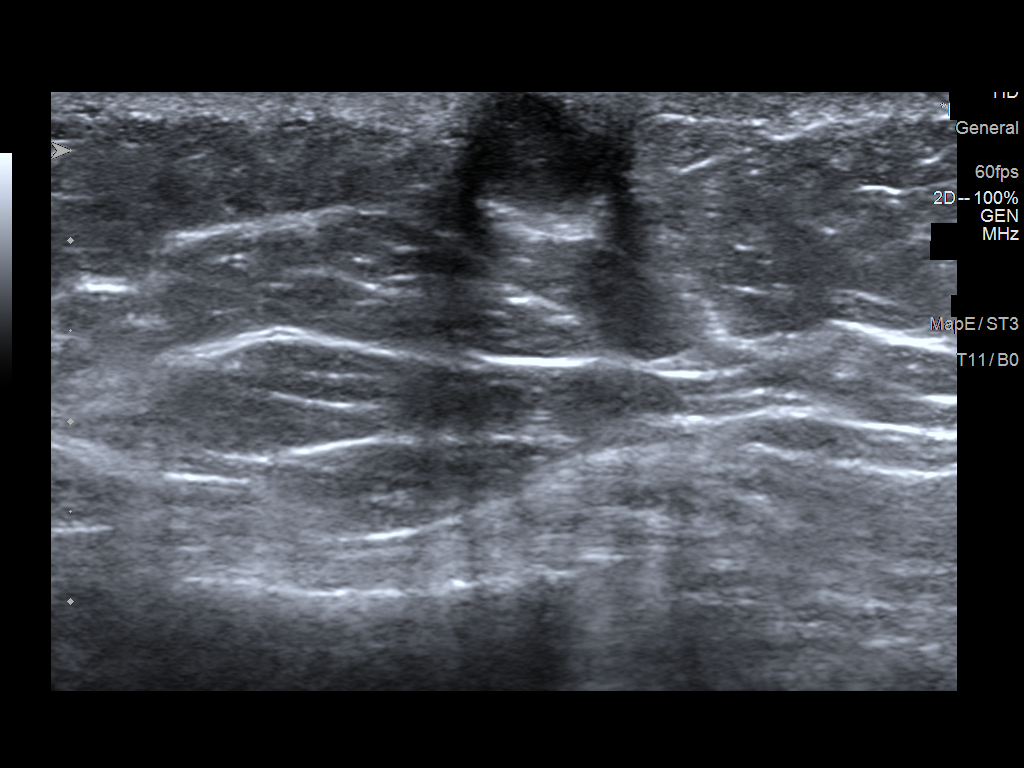
[im 4/7]
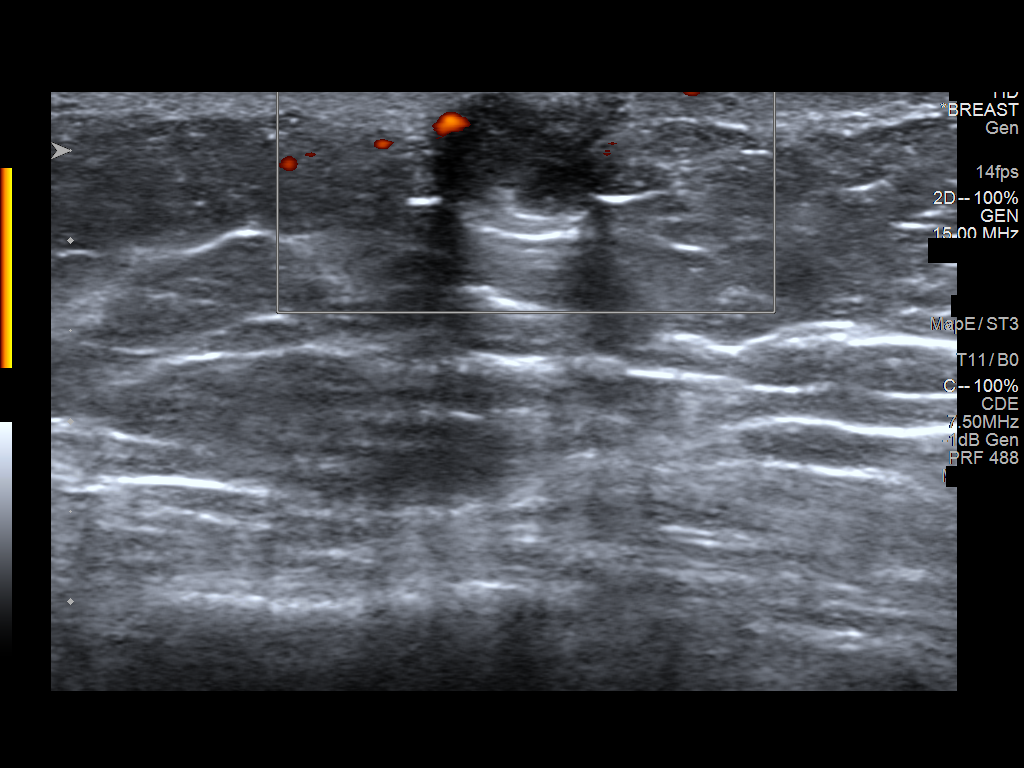
[im 5/7]
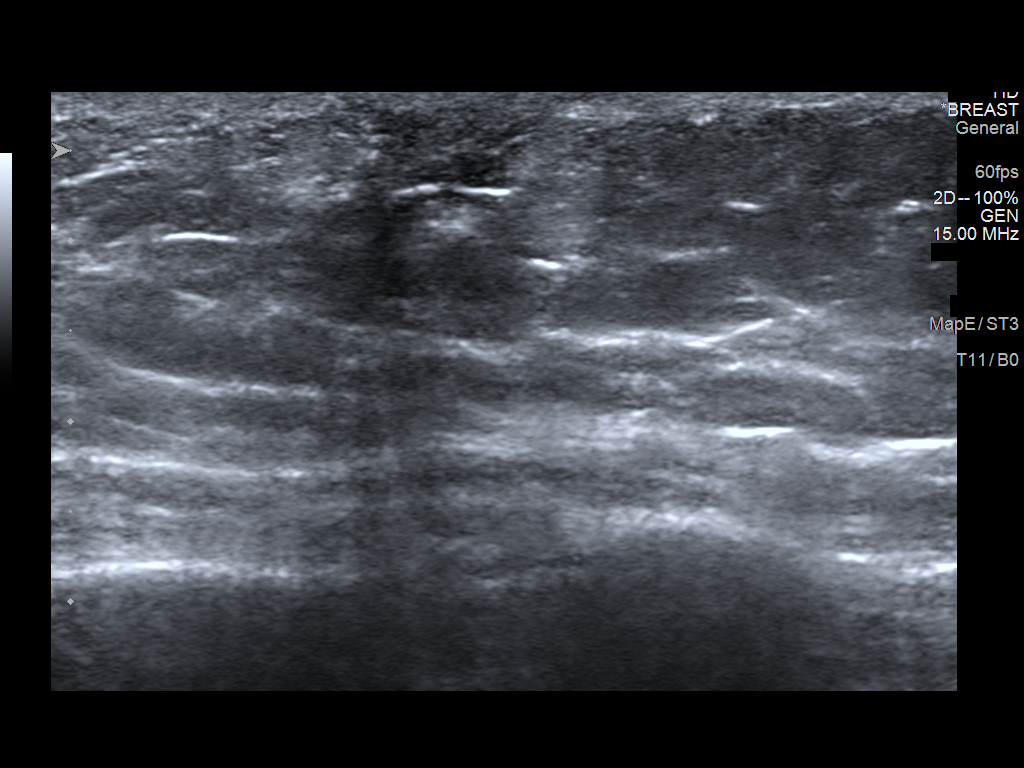
[im 6/7]
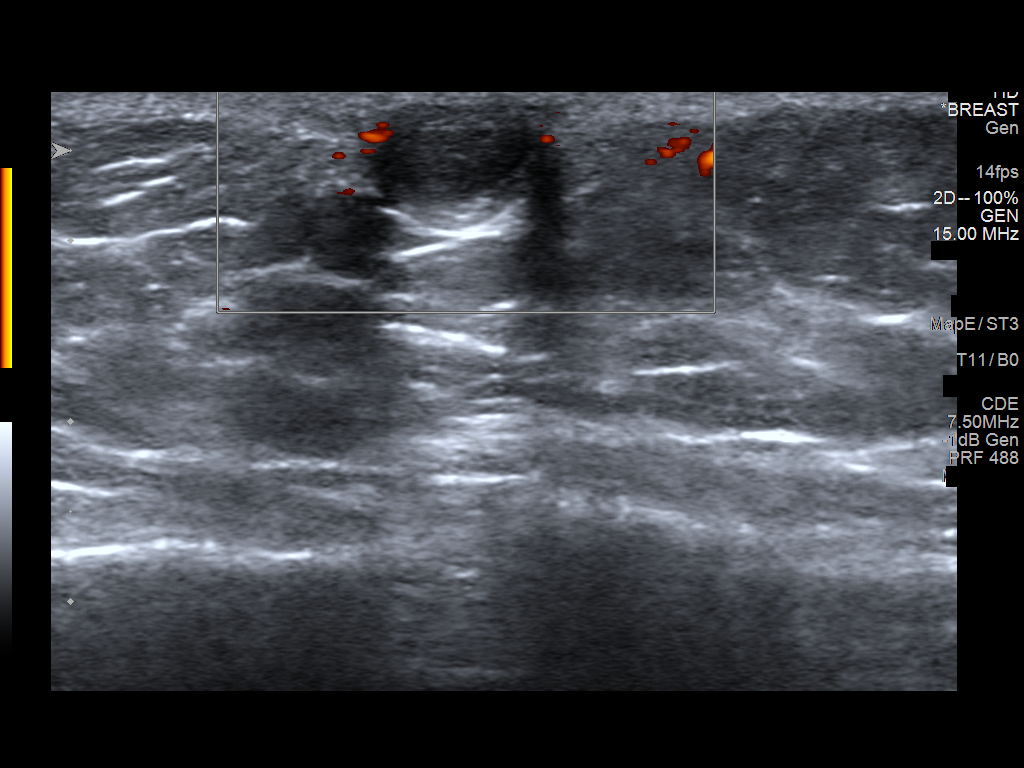
[im 7/7]
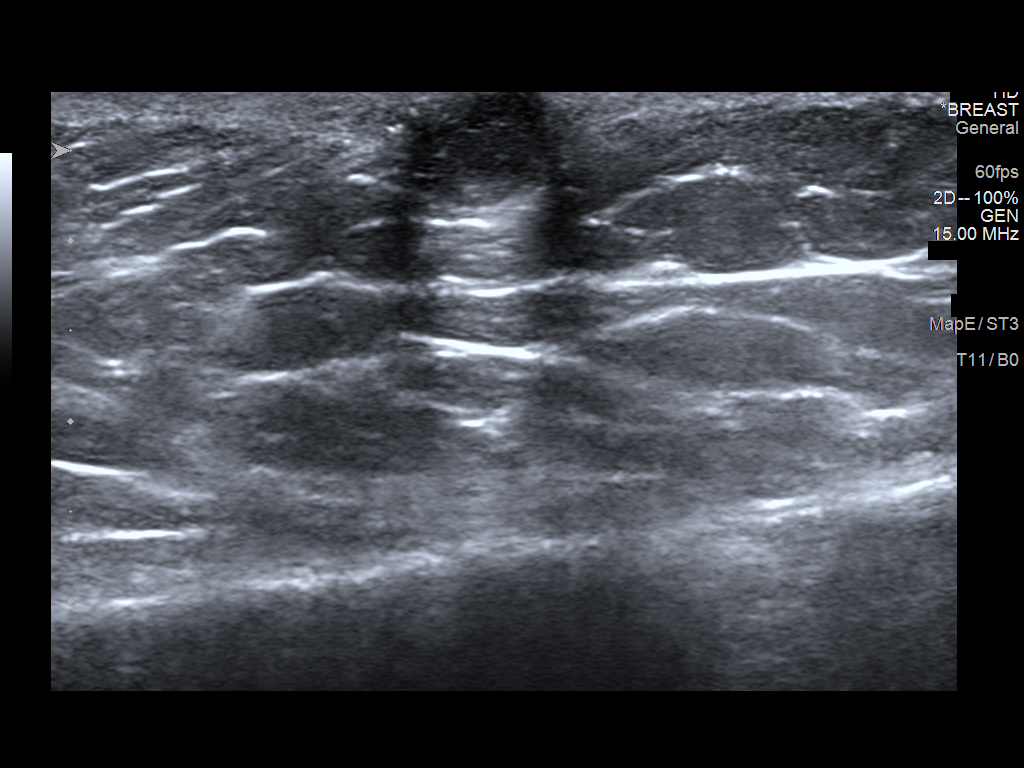

[7 of 7 positions shown; findings below may reference images not displayed]

ACR Breast Density Category b: There are scattered areas of
fibroglandular density.
FINDINGS: Additional imaging of the left breast was performed. There is a
superficial 9 mm mass in the medial aspect of the breast.

Mammographic images were processed with CAD.

On physical exam, I palpate a superficial lesion in the left breast
with a tract to the skin surface at [DATE] 14 cm from the nipple.

Targeted ultrasound is performed, showing a hypoechoic mass in the
skin of left breast at [DATE] 14 cm from the nipple with a poor
leading to the skin surface measuring 1.0 x 0.7 x 1.0 cm. It is
consistent with a benign sebaceous cyst.
IMPRESSION: Sebaceous cyst in the left breast.  No evidence of malignancy.

RECOMMENDATION:
Bilateral screening mammogram in 1 year is recommended.

I have discussed the findings and recommendations with the patient.
Results were also provided in writing at the conclusion of the
visit. If applicable, a reminder letter will be sent to the patient
regarding the next appointment.

BI-RADS CATEGORY  2: Benign.

## 2019-09-01 DIAGNOSIS — J309 Allergic rhinitis, unspecified: Secondary | ICD-10-CM | POA: Diagnosis not present

## 2019-09-01 DIAGNOSIS — M25569 Pain in unspecified knee: Secondary | ICD-10-CM | POA: Diagnosis not present

## 2019-09-01 DIAGNOSIS — I10 Essential (primary) hypertension: Secondary | ICD-10-CM | POA: Diagnosis not present

## 2019-09-04 DIAGNOSIS — H16223 Keratoconjunctivitis sicca, not specified as Sjogren's, bilateral: Secondary | ICD-10-CM | POA: Diagnosis not present

## 2019-09-04 DIAGNOSIS — Z8679 Personal history of other diseases of the circulatory system: Secondary | ICD-10-CM | POA: Diagnosis not present

## 2019-09-04 DIAGNOSIS — H1189 Other specified disorders of conjunctiva: Secondary | ICD-10-CM | POA: Diagnosis not present

## 2019-10-15 ENCOUNTER — Encounter (HOSPITAL_COMMUNITY): Payer: Self-pay

## 2019-10-27 DIAGNOSIS — I10 Essential (primary) hypertension: Secondary | ICD-10-CM | POA: Diagnosis not present

## 2019-11-11 DIAGNOSIS — M542 Cervicalgia: Secondary | ICD-10-CM | POA: Diagnosis not present

## 2019-11-11 DIAGNOSIS — M7581 Other shoulder lesions, right shoulder: Secondary | ICD-10-CM | POA: Diagnosis not present

## 2020-03-19 DIAGNOSIS — Z01419 Encounter for gynecological examination (general) (routine) without abnormal findings: Secondary | ICD-10-CM | POA: Diagnosis not present

## 2020-03-19 DIAGNOSIS — Z1231 Encounter for screening mammogram for malignant neoplasm of breast: Secondary | ICD-10-CM | POA: Diagnosis not present

## 2020-03-19 DIAGNOSIS — Z6841 Body Mass Index (BMI) 40.0 and over, adult: Secondary | ICD-10-CM | POA: Diagnosis not present

## 2020-03-19 DIAGNOSIS — Z1389 Encounter for screening for other disorder: Secondary | ICD-10-CM | POA: Diagnosis not present

## 2020-03-19 DIAGNOSIS — Z130 Encounter for screening for diseases of the blood and blood-forming organs and certain disorders involving the immune mechanism: Secondary | ICD-10-CM | POA: Diagnosis not present

## 2020-07-16 DIAGNOSIS — M79601 Pain in right arm: Secondary | ICD-10-CM | POA: Diagnosis not present

## 2020-07-16 DIAGNOSIS — I10 Essential (primary) hypertension: Secondary | ICD-10-CM | POA: Diagnosis not present

## 2020-07-16 DIAGNOSIS — Z9884 Bariatric surgery status: Secondary | ICD-10-CM | POA: Diagnosis not present

## 2020-07-16 DIAGNOSIS — E7800 Pure hypercholesterolemia, unspecified: Secondary | ICD-10-CM | POA: Diagnosis not present

## 2020-10-04 ENCOUNTER — Encounter: Payer: BC Managed Care – PPO | Attending: Surgery | Admitting: Skilled Nursing Facility1

## 2020-10-27 DIAGNOSIS — M238X2 Other internal derangements of left knee: Secondary | ICD-10-CM | POA: Diagnosis not present

## 2020-10-27 DIAGNOSIS — M7062 Trochanteric bursitis, left hip: Secondary | ICD-10-CM | POA: Diagnosis not present

## 2020-11-23 DIAGNOSIS — H5203 Hypermetropia, bilateral: Secondary | ICD-10-CM | POA: Diagnosis not present

## 2020-11-23 DIAGNOSIS — H04123 Dry eye syndrome of bilateral lacrimal glands: Secondary | ICD-10-CM | POA: Diagnosis not present

## 2020-11-23 DIAGNOSIS — H1189 Other specified disorders of conjunctiva: Secondary | ICD-10-CM | POA: Diagnosis not present

## 2020-11-23 DIAGNOSIS — H524 Presbyopia: Secondary | ICD-10-CM | POA: Diagnosis not present

## 2020-12-16 ENCOUNTER — Telehealth: Payer: Self-pay | Admitting: Family

## 2020-12-16 DIAGNOSIS — U071 COVID-19: Secondary | ICD-10-CM

## 2020-12-16 MED ORDER — PROMETHAZINE-DM 6.25-15 MG/5ML PO SYRP: 5.0000 mL | ORAL_SOLUTION | Freq: Four times a day (QID) | ORAL | 0 refills | Status: AC | PRN

## 2020-12-16 NOTE — Telephone Encounter (Signed)
Called to discuss with patient about COVID-19 symptoms and the use of one of the available treatments for those with mild to moderate Covid symptoms and at a high risk of hospitalization.  Pt appears to qualify for outpatient treatment due to co-morbid conditions and/or a member of an at-risk group in accordance with the FDA Emergency Use Authorization.    Symptom onset: 12/12/20 Tested positive: Home test 12/14/20 Vaccinated: Yes Booster? Yes  Immunocompromised? No Qualifiers: BMI  Connected via phone. Notices productive cough with phlegm as well as nasal congestion. Tells me today is the first day she feels a little short of breath. She has been taking Muccinex, Dayquill, Nyquill.   Her primary care provider has already sent in [place].  We will Rx promethazine-guafenesin.   Loel Dubonnet

## 2021-01-11 DIAGNOSIS — M79605 Pain in left leg: Secondary | ICD-10-CM | POA: Diagnosis not present

## 2021-01-11 DIAGNOSIS — E7800 Pure hypercholesterolemia, unspecified: Secondary | ICD-10-CM | POA: Diagnosis not present

## 2021-01-23 DIAGNOSIS — M25562 Pain in left knee: Secondary | ICD-10-CM | POA: Diagnosis not present

## 2021-07-14 DIAGNOSIS — Z005 Encounter for examination of potential donor of organ and tissue: Secondary | ICD-10-CM | POA: Diagnosis not present

## 2021-07-25 DIAGNOSIS — Z005 Encounter for examination of potential donor of organ and tissue: Secondary | ICD-10-CM | POA: Diagnosis not present

## 2021-08-17 DIAGNOSIS — R82998 Other abnormal findings in urine: Secondary | ICD-10-CM | POA: Diagnosis not present

## 2021-08-17 DIAGNOSIS — Z130 Encounter for screening for diseases of the blood and blood-forming organs and certain disorders involving the immune mechanism: Secondary | ICD-10-CM | POA: Diagnosis not present

## 2021-08-17 DIAGNOSIS — Z6841 Body Mass Index (BMI) 40.0 and over, adult: Secondary | ICD-10-CM | POA: Diagnosis not present

## 2021-08-17 DIAGNOSIS — Z01419 Encounter for gynecological examination (general) (routine) without abnormal findings: Secondary | ICD-10-CM | POA: Diagnosis not present

## 2021-08-17 DIAGNOSIS — Z1231 Encounter for screening mammogram for malignant neoplasm of breast: Secondary | ICD-10-CM | POA: Diagnosis not present

## 2021-08-17 DIAGNOSIS — Z1389 Encounter for screening for other disorder: Secondary | ICD-10-CM | POA: Diagnosis not present

## 2021-08-22 DIAGNOSIS — J0100 Acute maxillary sinusitis, unspecified: Secondary | ICD-10-CM | POA: Diagnosis not present

## 2021-08-22 DIAGNOSIS — R0981 Nasal congestion: Secondary | ICD-10-CM | POA: Diagnosis not present

## 2021-10-24 DIAGNOSIS — Z005 Encounter for examination of potential donor of organ and tissue: Secondary | ICD-10-CM | POA: Diagnosis not present

## 2021-10-27 ENCOUNTER — Encounter (HOSPITAL_COMMUNITY): Payer: Self-pay | Admitting: *Deleted

## 2021-10-27 DIAGNOSIS — I10 Essential (primary) hypertension: Secondary | ICD-10-CM | POA: Diagnosis not present

## 2021-10-27 DIAGNOSIS — R300 Dysuria: Secondary | ICD-10-CM | POA: Diagnosis not present

## 2021-10-27 DIAGNOSIS — E7800 Pure hypercholesterolemia, unspecified: Secondary | ICD-10-CM | POA: Diagnosis not present

## 2021-10-27 DIAGNOSIS — Z23 Encounter for immunization: Secondary | ICD-10-CM | POA: Diagnosis not present

## 2021-10-27 DIAGNOSIS — Z0000 Encounter for general adult medical examination without abnormal findings: Secondary | ICD-10-CM | POA: Diagnosis not present

## 2021-10-27 DIAGNOSIS — M79671 Pain in right foot: Secondary | ICD-10-CM | POA: Diagnosis not present

## 2021-11-04 ENCOUNTER — Ambulatory Visit (INDEPENDENT_AMBULATORY_CARE_PROVIDER_SITE_OTHER): Payer: BC Managed Care – PPO

## 2021-11-04 ENCOUNTER — Other Ambulatory Visit: Payer: Self-pay

## 2021-11-04 ENCOUNTER — Ambulatory Visit: Payer: BC Managed Care – PPO | Admitting: Podiatry

## 2021-11-04 ENCOUNTER — Encounter: Payer: Self-pay | Admitting: Podiatry

## 2021-11-04 DIAGNOSIS — M21619 Bunion of unspecified foot: Secondary | ICD-10-CM | POA: Diagnosis not present

## 2021-11-04 DIAGNOSIS — M7751 Other enthesopathy of right foot: Secondary | ICD-10-CM | POA: Diagnosis not present

## 2021-11-04 DIAGNOSIS — M79671 Pain in right foot: Secondary | ICD-10-CM

## 2021-11-04 DIAGNOSIS — M722 Plantar fascial fibromatosis: Secondary | ICD-10-CM

## 2021-11-04 MED ORDER — TRIAMCINOLONE ACETONIDE 10 MG/ML IJ SUSP
20.0000 mg | Freq: Once | INTRAMUSCULAR | Status: AC
  Administered 2021-11-04: 20 mg

## 2021-11-04 NOTE — Patient Instructions (Signed)

## 2021-11-06 NOTE — Progress Notes (Signed)
Subjective:  ? ?Patient ID: Sydney Hamilton, female   DOB: 46 y.o.   MRN: [id number]  ? ?HPI ?Patient presents with 2 different pains with 1 being within the bottom of the heel at the insertion and secondarily with exquisite discomfort in the outside of the right ankle.  States this has been going on for a number of months and gradually getting worse and patient does not smoke currently and likes to be active ? ? ?Review of Systems  ?All other systems reviewed and are negative. ? ? ?   ?Objective:  ?Physical Exam ?Vitals and nursing note reviewed.  ?Constitutional:   ?   Appearance: She is well-developed.  ?Pulmonary:  ?   Effort: Pulmonary effort is normal.  ?Musculoskeletal:     ?   General: Normal range of motion.  ?Skin: ?   General: Skin is warm.  ?Neurological:  ?   Mental Status: She is alert.  ?  ?Neurovascular status intact muscle strength adequate range of motion adequate with inflammation fluid of the sinus tarsi right with pain within the joint surface and also inflammation pain with exquisite discomfort of the plantar fascia right at the insertion calcaneus with moderate depression of the arch noted ? ?   ?Assessment:  ?Inflammatory capsulitis of the sinus tarsi right along with Planter fasciitis right with possibility that compensatory gait is part of this problem ? ?   ?Plan:  ?H&P reviewed both conditions discussed foot structure in the moderate flatfoot deformity.  At this point I went ahead and I did do sterile prep and I injected the sinus tarsi right 3 mg Kenalog 5 mg Xylocaine and then did sterile prep and injected the plantar fascia right 3 mg Kenalog 5 mg Xylocaine.  I applied fascial brace to lift up the arch and take stress off the plantar fascia and provide for stability and patient will be seen back 3 weeks ? ?X-rays did not indicate arthritis did indicate plantar spur and moderate depression of the arch ?   ? ? ?

## 2021-11-09 ENCOUNTER — Other Ambulatory Visit: Payer: Self-pay | Admitting: Podiatry

## 2021-11-09 DIAGNOSIS — M722 Plantar fascial fibromatosis: Secondary | ICD-10-CM

## 2021-11-10 DIAGNOSIS — Z005 Encounter for examination of potential donor of organ and tissue: Secondary | ICD-10-CM | POA: Diagnosis not present

## 2021-12-24 DIAGNOSIS — B349 Viral infection, unspecified: Secondary | ICD-10-CM | POA: Diagnosis not present

## 2021-12-24 DIAGNOSIS — J029 Acute pharyngitis, unspecified: Secondary | ICD-10-CM | POA: Diagnosis not present

## 2021-12-26 DIAGNOSIS — R0981 Nasal congestion: Secondary | ICD-10-CM | POA: Diagnosis not present

## 2021-12-26 DIAGNOSIS — J3489 Other specified disorders of nose and nasal sinuses: Secondary | ICD-10-CM | POA: Diagnosis not present

## 2022-02-07 DIAGNOSIS — M1712 Unilateral primary osteoarthritis, left knee: Secondary | ICD-10-CM | POA: Diagnosis not present

## 2022-02-07 DIAGNOSIS — M25562 Pain in left knee: Secondary | ICD-10-CM | POA: Diagnosis not present

## 2022-03-21 DIAGNOSIS — I10 Essential (primary) hypertension: Secondary | ICD-10-CM | POA: Diagnosis not present

## 2022-03-21 DIAGNOSIS — S70361A Insect bite (nonvenomous), right thigh, initial encounter: Secondary | ICD-10-CM | POA: Diagnosis not present

## 2022-03-21 DIAGNOSIS — S00411A Abrasion of right ear, initial encounter: Secondary | ICD-10-CM | POA: Diagnosis not present

## 2022-06-16 DIAGNOSIS — R058 Other specified cough: Secondary | ICD-10-CM | POA: Diagnosis not present

## 2022-06-16 DIAGNOSIS — J029 Acute pharyngitis, unspecified: Secondary | ICD-10-CM | POA: Diagnosis not present

## 2022-06-16 DIAGNOSIS — H65191 Other acute nonsuppurative otitis media, right ear: Secondary | ICD-10-CM | POA: Diagnosis not present

## 2022-06-16 DIAGNOSIS — R490 Dysphonia: Secondary | ICD-10-CM | POA: Diagnosis not present

## 2022-09-18 DIAGNOSIS — J45909 Unspecified asthma, uncomplicated: Secondary | ICD-10-CM | POA: Diagnosis not present

## 2022-09-18 DIAGNOSIS — I10 Essential (primary) hypertension: Secondary | ICD-10-CM | POA: Diagnosis not present

## 2022-09-18 DIAGNOSIS — K30 Functional dyspepsia: Secondary | ICD-10-CM | POA: Diagnosis not present

## 2022-09-18 DIAGNOSIS — E7800 Pure hypercholesterolemia, unspecified: Secondary | ICD-10-CM | POA: Diagnosis not present

## 2022-11-03 ENCOUNTER — Encounter (HOSPITAL_COMMUNITY): Payer: Self-pay | Admitting: *Deleted

## 2022-11-21 DIAGNOSIS — Z1389 Encounter for screening for other disorder: Secondary | ICD-10-CM | POA: Diagnosis not present

## 2022-11-21 DIAGNOSIS — Z130 Encounter for screening for diseases of the blood and blood-forming organs and certain disorders involving the immune mechanism: Secondary | ICD-10-CM | POA: Diagnosis not present

## 2022-11-21 DIAGNOSIS — Z1231 Encounter for screening mammogram for malignant neoplasm of breast: Secondary | ICD-10-CM | POA: Diagnosis not present

## 2022-11-21 DIAGNOSIS — Z01419 Encounter for gynecological examination (general) (routine) without abnormal findings: Secondary | ICD-10-CM | POA: Diagnosis not present

## 2022-11-21 DIAGNOSIS — R208 Other disturbances of skin sensation: Secondary | ICD-10-CM | POA: Diagnosis not present

## 2022-11-21 DIAGNOSIS — Z6841 Body Mass Index (BMI) 40.0 and over, adult: Secondary | ICD-10-CM | POA: Diagnosis not present

## 2022-11-23 DIAGNOSIS — Z9884 Bariatric surgery status: Secondary | ICD-10-CM | POA: Diagnosis not present

## 2022-12-07 DIAGNOSIS — H52203 Unspecified astigmatism, bilateral: Secondary | ICD-10-CM | POA: Diagnosis not present

## 2022-12-07 DIAGNOSIS — H5213 Myopia, bilateral: Secondary | ICD-10-CM | POA: Diagnosis not present

## 2022-12-07 DIAGNOSIS — H524 Presbyopia: Secondary | ICD-10-CM | POA: Diagnosis not present

## 2022-12-07 DIAGNOSIS — H16223 Keratoconjunctivitis sicca, not specified as Sjogren's, bilateral: Secondary | ICD-10-CM | POA: Diagnosis not present

## 2023-04-06 DIAGNOSIS — M25562 Pain in left knee: Secondary | ICD-10-CM | POA: Diagnosis not present

## 2023-04-10 ENCOUNTER — Encounter: Payer: BC Managed Care – PPO | Admitting: Bariatrics

## 2023-04-10 DIAGNOSIS — Z0289 Encounter for other administrative examinations: Secondary | ICD-10-CM

## 2023-04-24 ENCOUNTER — Ambulatory Visit: Payer: BC Managed Care – PPO | Admitting: Bariatrics

## 2023-04-24 ENCOUNTER — Encounter: Payer: Self-pay | Admitting: Bariatrics

## 2023-04-24 VITALS — BP 122/78 | HR 69 | Temp 97.5°F | Ht 65.5 in | Wt 296.0 lb

## 2023-04-24 DIAGNOSIS — R0602 Shortness of breath: Secondary | ICD-10-CM | POA: Diagnosis not present

## 2023-04-24 DIAGNOSIS — F509 Eating disorder, unspecified: Secondary | ICD-10-CM | POA: Insufficient documentation

## 2023-04-24 DIAGNOSIS — F5089 Other specified eating disorder: Secondary | ICD-10-CM

## 2023-04-24 DIAGNOSIS — Z9884 Bariatric surgery status: Secondary | ICD-10-CM

## 2023-04-24 DIAGNOSIS — R5383 Other fatigue: Secondary | ICD-10-CM | POA: Diagnosis not present

## 2023-04-24 DIAGNOSIS — Z0000 Encounter for general adult medical examination without abnormal findings: Secondary | ICD-10-CM

## 2023-04-24 DIAGNOSIS — R7309 Other abnormal glucose: Secondary | ICD-10-CM | POA: Diagnosis not present

## 2023-04-24 DIAGNOSIS — I10 Essential (primary) hypertension: Secondary | ICD-10-CM

## 2023-04-24 DIAGNOSIS — Z1331 Encounter for screening for depression: Secondary | ICD-10-CM

## 2023-04-24 DIAGNOSIS — Z6841 Body Mass Index (BMI) 40.0 and over, adult: Secondary | ICD-10-CM

## 2023-04-24 DIAGNOSIS — F32A Depression, unspecified: Secondary | ICD-10-CM

## 2023-04-24 DIAGNOSIS — E559 Vitamin D deficiency, unspecified: Secondary | ICD-10-CM | POA: Diagnosis not present

## 2023-04-24 DIAGNOSIS — E66813 Obesity, class 3: Secondary | ICD-10-CM | POA: Insufficient documentation

## 2023-04-25 ENCOUNTER — Encounter (INDEPENDENT_AMBULATORY_CARE_PROVIDER_SITE_OTHER): Payer: Self-pay | Admitting: Bariatrics

## 2023-04-25 DIAGNOSIS — E7800 Pure hypercholesterolemia, unspecified: Secondary | ICD-10-CM | POA: Insufficient documentation

## 2023-04-25 DIAGNOSIS — R7303 Prediabetes: Secondary | ICD-10-CM | POA: Insufficient documentation

## 2023-04-25 LAB — HEMOGLOBIN A1C
Est. average glucose Bld gHb Est-mCnc: 126 mg/dL
Hgb A1c MFr Bld: 6 % — ABNORMAL HIGH (ref 4.8–5.6)

## 2023-04-25 LAB — TSH+T4F+T3FREE
Free T4: 1.3 ng/dL (ref 0.82–1.77)
T3, Free: 2.6 pg/mL (ref 2.0–4.4)
TSH: 1.8 u[IU]/mL (ref 0.450–4.500)

## 2023-04-25 LAB — COMPREHENSIVE METABOLIC PANEL
ALT: 13 IU/L (ref 0–32)
AST: 10 IU/L (ref 0–40)
Albumin: 3.9 g/dL (ref 3.9–4.9)
Alkaline Phosphatase: 77 IU/L (ref 44–121)
BUN/Creatinine Ratio: 18 (ref 9–23)
BUN: 14 mg/dL (ref 6–24)
Bilirubin Total: 0.6 mg/dL (ref 0.0–1.2)
CO2: 25 mmol/L (ref 20–29)
Calcium: 9.7 mg/dL (ref 8.7–10.2)
Chloride: 99 mmol/L (ref 96–106)
Creatinine, Ser: 0.77 mg/dL (ref 0.57–1.00)
Globulin, Total: 3.2 g/dL (ref 1.5–4.5)
Glucose: 86 mg/dL (ref 70–99)
Potassium: 4 mmol/L (ref 3.5–5.2)
Sodium: 134 mmol/L (ref 134–144)
Total Protein: 7.1 g/dL (ref 6.0–8.5)
eGFR: 95 mL/min/{1.73_m2} (ref >59)

## 2023-04-25 LAB — LIPID PANEL WITH LDL/HDL RATIO
Cholesterol, Total: 215 mg/dL — ABNORMAL HIGH (ref 100–199)
HDL: 57 mg/dL (ref >39)
LDL Chol Calc (NIH): 136 mg/dL — ABNORMAL HIGH (ref 0–99)
LDL/HDL Ratio: 2.4 ratio (ref 0.0–3.2)
Triglycerides: 125 mg/dL (ref 0–149)
VLDL Cholesterol Cal: 22 mg/dL (ref 5–40)

## 2023-04-25 LAB — INSULIN, RANDOM: INSULIN: 18.7 u[IU]/mL (ref 2.6–24.9)

## 2023-04-25 LAB — VITAMIN B12: Vitamin B-12: 333 pg/mL (ref 232–1245)

## 2023-04-25 LAB — VITAMIN D 25 HYDROXY (VIT D DEFICIENCY, FRACTURES): Vit D, 25-Hydroxy: 7.1 ng/mL — ABNORMAL LOW (ref 30.0–100.0)

## 2023-05-01 ENCOUNTER — Encounter: Payer: Self-pay | Admitting: Bariatrics

## 2023-05-01 NOTE — Progress Notes (Signed)
Chief Complaint:   OBESITY Sydney Hamilton (MR# [id number]) is a 47 y.o. female who presents for evaluation and treatment of obesity and related comorbidities. Current BMI is Body mass index is 48.51 kg/m. Sydney Hamilton has been struggling with her weight for many years and has been unsuccessful in either losing weight, maintaining weight loss, or reaching her healthy weight goal.  Sydney Hamilton is currently in the action stage of change and ready to dedicate time achieving and maintaining a healthier weight. Sydney Hamilton is interested in becoming our patient and working on intensive lifestyle modifications including (but not limited to) diet and exercise for weight loss.  Patient is here for her initial visit.  She has a family history of obesity.  Sydney Hamilton's habits were reviewed today and are as follows: Her family eats meals together, she thinks her family will eat healthier with her, she struggles with family and or coworkers weight loss sabotage, her desired weight loss is 106 lbs, she started gaining weight at age 52, her heaviest weight ever was 330 pounds, she has significant food cravings issues, she skips meals frequently, she frequently eats larger portions than normal, and she struggles with emotional eating.  Depression Screen Sydney Hamilton's Food and Mood (modified PHQ-9) score was 7.  Subjective:   1. Other fatigue [name] admits to daytime somnolence and admits to waking up still tired. Patient has a history of symptoms of daytime fatigue and morning fatigue. Sydney Hamilton generally gets 7 hours of sleep per night, and states that she has nightime awakenings. Snoring is present. Apneic episodes are not present. Epworth Sleepiness Score is 2.   2. SOB (shortness of breath) on exertion Sydney Hamilton notes increasing shortness of breath with exercising and seems to be worsening over time with weight gain. She notes getting out of breath sooner with activity than she used to. This has not gotten worse recently.  Sydney Hamilton denies shortness of breath at rest or orthopnea.  3. Essential hypertension Patient is taking hydrochlorothiazide and Norvasc, and her blood pressure is controlled.  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  Her heaviest weight was > 300 pounds, and her lowest weight was 230 pounds.  She has some restrictions.  5. Health care maintenance Given obesity.   6. Elevated glucose Patient is not on medications currently.  7. Vitamin D deficiency Patient is taking multivitamins.  8. Other disorder of eating Patient is not on medications currently.  Assessment/Plan:   1. Other fatigue Sydney Hamilton does feel that her weight is causing her energy to be lower than it should be. Fatigue may be related to obesity, depression or many other causes. Labs will be ordered, and in the meanwhile, Asly will focus on self care including making healthy food choices, increasing physical activity and focusing on stress reduction.  - EKG 12-Lead - Insulin, random - Hemoglobin A1c - TSH+T4F+T3Free - VITAMIN D 25 Hydroxy (Vit-D Deficiency, Fractures) - Lipid Panel With LDL/HDL Ratio - Comprehensive metabolic panel - Vitamin B12  2. SOB (shortness of breath) on exertion [name] does feel that she gets out of breath more easily that she used to when she exercises. Sydney Hamilton's shortness of breath appears to be obesity related and exercise induced. She has agreed to work on weight loss and gradually increase exercise to treat her exercise induced shortness of breath. Will continue to monitor closely.  3. Essential hypertension Patient will continue her medications as directed.  4. S/P laparoscopic sleeve gastrectomy August  2018 We will check labs today.  Patient is to eat small frequent meals.  No fluids directly between meals or after.  - Insulin, random - Hemoglobin A1c - TSH+T4F+T3Free - VITAMIN D 25 Hydroxy (Vit-D Deficiency,  Fractures) - Lipid Panel With LDL/HDL Ratio - Comprehensive metabolic panel - Vitamin B12  5. Health care maintenance We will check labs today. EKG and IC were done and reviewed with the patient.   - Insulin, random - Hemoglobin A1c - TSH+T4F+T3Free - VITAMIN D 25 Hydroxy (Vit-D Deficiency, Fractures) - Lipid Panel With LDL/HDL Ratio - Comprehensive metabolic panel - Vitamin B12  6. Elevated glucose We will check labs today, we will follow-up at patient's next visit.  - Insulin, random - Hemoglobin A1c  7. Vitamin D deficiency We will check labs today, we will follow-up at patient's next visit.  - VITAMIN D 25 Hydroxy (Vit-D Deficiency, Fractures)  8. Other disorder of eating Patient was referred to Dr. Dewaine Conger, our Bariatric Psychologist, for evaluation due to her elevated PHQ-9 score and significant struggles with emotional eating.  9. Depression screening Sydney Hamilton had a positive depression screening. Depression is commonly associated with obesity and often results in emotional eating behaviors. We will monitor this closely and work on CBT to help improve the non-hunger eating patterns. Referral to Psychology may be required if no improvement is seen as she continues in our clinic.  10. Morbid obesity with body mass index (BMI) of 45.0 to 49.9 in adult Sydney Hamilton) We will check labs today, and we will follow-up at patient's next visit.   - Insulin, random - Hemoglobin A1c - TSH+T4F+T3Free - VITAMIN D 25 Hydroxy (Vit-D Deficiency, Fractures) - Lipid Panel With LDL/HDL Ratio - Comprehensive metabolic panel - Vitamin B12  11. Obesity, Class III, BMI 40-49.9 (morbid obesity) (HCC) Sydney Hamilton is currently in the action stage of change and her goal is to continue with weight loss efforts. I recommend Sydney Hamilton begin the structured treatment plan as follows:  She has agreed to the Category 2 Plan.  Meal planning and intentional eating were discussed.  Patient is to work on not skipping  meals.  Protein shakes were discussed.  Exercise goals: No exercise has been prescribed at this time.  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  Behavioral modification strategies: increasing lean protein intake, decreasing simple carbohydrates, increasing vegetables, increasing water intake, decreasing eating out, no skipping meals, meal planning and cooking strategies, keeping healthy foods in the home, and planning for success.  She was informed of the importance of frequent follow-up visits to maximize her success with intensive lifestyle modifications for her multiple health conditions. She was informed we would discuss her lab results at her next visit unless there is a critical issue that needs to be addressed sooner. Sydney Hamilton agreed to keep her next visit at the agreed upon time to discuss these results.  Objective:   Blood pressure 122/78, pulse 69, temperature (!) 97.5 F (36.4 C), height 5' 5.5" (1.664 m), weight 296 lb (134.3 kg), last menstrual period 02/21/2015, SpO2 100%. Body mass index is 48.51 kg/m.  EKG: Normal sinus rhythm, rate 71 BPM.  Indirect Calorimeter completed today shows a VO2 of 233 and a REE of 1613.  Her calculated basal metabolic rate is 4098 thus her basal metabolic rate is worse than expected.  General: Cooperative, alert, well developed, in no acute distress. HEENT: Conjunctivae and lids unremarkable. Cardiovascular: Regular rhythm.  Lungs: Normal work of breathing. Neurologic: No focal deficits.   Lab  Results  Component Value Date   CREATININE 0.77 04/24/2023   BUN 14 04/24/2023   NA 134 04/24/2023   K 4.0 04/24/2023   CL 99 04/24/2023   CO2 25 04/24/2023   Lab Results  Component Value Date   ALT 13 04/24/2023   AST 10 04/24/2023   ALKPHOS 77 04/24/2023   BILITOT 0.6 04/24/2023   Lab Results  Component Value Date   HGBA1C 6.0 (H) 04/24/2023   HGBA1C 5.8 10/02/2013   Lab Results  Component Value Date   INSULIN 18.7  04/24/2023   Lab Results  Component Value Date   TSH 1.800 04/24/2023   Lab Results  Component Value Date   CHOL 215 (H) 04/24/2023   HDL 57 04/24/2023   LDLCALC 136 (H) 04/24/2023   TRIG 125 04/24/2023   Lab Results  Component Value Date   WBC 8.5 04/04/2017   HGB 11.9 (L) 04/04/2017   HCT 35.7 (L) 04/04/2017   MCV 82.8 04/04/2017   PLT 196 04/04/2017   Lab Results  Component Value Date   IRON 62 10/02/2013   TIBC 425 10/02/2013   FERRITIN 7 (L) 10/02/2013   Attestation Statements:   Reviewed by clinician on day of visit: allergies, medications, problem list, medical history, surgical history, family history, social history, and previous encounter notes.   Trude Mcburney, am acting as Energy manager for [employer], DO.  I have reviewed the above documentation for accuracy and completeness, and I agree with the above. Corinna Capra, DO

## 2023-05-08 ENCOUNTER — Ambulatory Visit: Payer: BC Managed Care – PPO | Admitting: Nurse Practitioner

## 2023-05-08 ENCOUNTER — Encounter: Payer: Self-pay | Admitting: Nurse Practitioner

## 2023-05-08 VITALS — BP 116/82 | HR 79 | Temp 97.6°F | Ht 65.5 in | Wt 287.0 lb

## 2023-05-08 DIAGNOSIS — E785 Hyperlipidemia, unspecified: Secondary | ICD-10-CM | POA: Diagnosis not present

## 2023-05-08 DIAGNOSIS — I10 Essential (primary) hypertension: Secondary | ICD-10-CM | POA: Diagnosis not present

## 2023-05-08 DIAGNOSIS — E559 Vitamin D deficiency, unspecified: Secondary | ICD-10-CM

## 2023-05-08 DIAGNOSIS — E538 Deficiency of other specified B group vitamins: Secondary | ICD-10-CM

## 2023-05-08 DIAGNOSIS — Z6841 Body Mass Index (BMI) 40.0 and over, adult: Secondary | ICD-10-CM

## 2023-05-08 DIAGNOSIS — R7303 Prediabetes: Secondary | ICD-10-CM

## 2023-05-08 DIAGNOSIS — R7989 Other specified abnormal findings of blood chemistry: Secondary | ICD-10-CM

## 2023-05-08 DIAGNOSIS — E7849 Other hyperlipidemia: Secondary | ICD-10-CM

## 2023-05-08 MED ORDER — VITAMIN D (ERGOCALCIFEROL) 1.25 MG (50000 UNIT) PO CAPS: 50000.0000 [IU] | ORAL_CAPSULE | ORAL | 0 refills | Status: DC

## 2023-05-08 NOTE — Progress Notes (Signed)
Office: [phone]  /  Fax: [phone]  WEIGHT SUMMARY AND BIOMETRICS  Weight Lost Since Last Visit: 9 lb  Weight Gained Since Last Visit: 0   Vitals Temp: 97.6 F (36.4 C) BP: 116/82 Pulse Rate: 79 SpO2: 100 %   Anthropometric Measurements Height: 5' 5.5" (1.664 m) Weight: 287 lb (130.2 kg) BMI (Calculated): 47.02 Weight at Last Visit: 296 lb Weight Lost Since Last Visit: 9 lb Weight Gained Since Last Visit: 0 Starting Weight: 5'5.5 Total Weight Loss (lbs): 9 lb (4.082 kg) Peak Weight: 320 lb Waist Measurement : 54 inches   Body Composition  Body Fat %: 50.5 % Fat Mass (lbs): 145 lbs Muscle Mass (lbs): 135 lbs Total Body Water (lbs): 95.4 lbs Visceral Fat Rating : 17   Other Clinical Data RMR: [id number] Fasting: no Labs: no Today's Visit #: 2 Starting Date: 04/24/23     HPI  Chief Complaint: OBESITY  Sydney Hamilton is here to discuss her progress with her obesity treatment plan. She is on the the Category 2 Plan and states she is following her eating plan approximately 99 % of the time. She states she is exercising Tae-Bo tape, 30 minutes 2 days per week.   Interval History:  Since last office visit she has lost 9 pounds.  She is trying to increase protein intake.  She is drinking water and 1-2 premier protein shake/s daily.  Struggles with polyphagia and cravings.  Notes some constipation.     Pharmacotherapy for weight loss: She is not currently taking medications  for medical weight loss.    Bariatric surgery:  Patient is status post Sleeve gastrectomy by Dr. Daphine Deutscher in August 2018.  Her highest weight prior to surgery was > 300 lbs and her nadir weight after surgery was 230 lbs.  She is taking None.  She reports yes restriction.    Hypertension Hypertension stable.  Medication(s): Norvasc 5mg , hydrochlorothiazide 25mg . Denies side effects.  Denies chest pain, palpitations and SOB.  BP Readings from Last 3 Encounters:  05/08/23 116/82  04/24/23  122/78  04/04/17 118/65   Lab Results  Component Value Date   CREATININE 0.77 04/24/2023   CREATININE 0.90 04/03/2017   CREATININE 0.68 03/26/2017    Low Vit B12 Patient is not currently taking a MV or Vit B12 OTC  Vit D deficiency  She is not taking Vit D or a MV.   Lab Results  Component Value Date   VD25OH 7.1 (L) 04/24/2023     Hyperlipidemia Medication(s): never been on meds in the past.   Cardiovascular risk factors: dyslipidemia, hypertension, and obesity (BMI >= 30 kg/m2) FH:  mgm  Lab Results  Component Value Date   CHOL 215 (H) 04/24/2023   HDL 57 04/24/2023   LDLCALC 136 (H) 04/24/2023   TRIG 125 04/24/2023   Lab Results  Component Value Date   ALT 13 04/24/2023   AST 10 04/24/2023   ALKPHOS 77 04/24/2023   BILITOT 0.6 04/24/2023   The 10-year ASCVD risk score (Arnett DK, et al., 2019) is: 2.1%   Values used to calculate the score:     Age: 47 years     Sex: Female     Is Non-Hispanic African American: Yes     Diabetic: No     Tobacco smoker: No     Systolic Blood Pressure: 116 mmHg     Is BP treated: Yes     HDL Cholesterol: 57 mg/dL     Total Cholesterol: 215 mg/dL  Prediabetes Last A1c was 6.0  Medication(s): never been on meds Polyphagia:Yes FH: mgm with DMT1  Lab Results  Component Value Date   HGBA1C 6.0 (H) 04/24/2023   HGBA1C 5.8 10/02/2013   Lab Results  Component Value Date   INSULIN 18.7 04/24/2023     PHYSICAL EXAM:  Blood pressure 116/82, pulse 79, temperature 97.6 F (36.4 C), height 5' 5.5" (1.664 m), weight 287 lb (130.2 kg), last menstrual period 02/21/2015, SpO2 100%. Body mass index is 47.03 kg/m.  General: She is overweight, cooperative, alert, well developed, and in no acute distress. PSYCH: Has normal mood, affect and thought process.   Extremities: No edema.  Neurologic: No gross sensory or motor deficits. No tremors or fasciculations noted.    DIAGNOSTIC DATA REVIEWED:  BMET    Component Value  Date/Time   NA 134 04/24/2023 1002   NA 134 (L) 10/02/2013 1006   K 4.0 04/24/2023 1002   K 3.7 10/02/2013 1006   CL 99 04/24/2023 1002   CL 105 10/02/2013 1006   CO2 25 04/24/2023 1002   CO2 26 10/02/2013 1006   GLUCOSE 86 04/24/2023 1002   GLUCOSE 77 03/26/2017 1507   GLUCOSE 84 10/02/2013 1006   BUN 14 04/24/2023 1002   BUN 11 10/02/2013 1006   CREATININE 0.77 04/24/2023 1002   CREATININE 0.81 10/02/2013 1006   CALCIUM 9.7 04/24/2023 1002   CALCIUM 9.0 10/02/2013 1006   GFRNONAA >60 04/03/2017 1916   GFRNONAA >60 10/02/2013 1006   GFRAA >60 04/03/2017 1916   GFRAA >60 10/02/2013 1006   Lab Results  Component Value Date   HGBA1C 6.0 (H) 04/24/2023   HGBA1C 5.8 10/02/2013   Lab Results  Component Value Date   INSULIN 18.7 04/24/2023   Lab Results  Component Value Date   TSH 1.800 04/24/2023   CBC    Component Value Date/Time   WBC 8.5 04/04/2017 0527   RBC 4.31 04/04/2017 0527   HGB 11.9 (L) 04/04/2017 0527   HGB 12.7 10/02/2013 1006   HCT 35.7 (L) 04/04/2017 0527   HCT 38.4 10/02/2013 1006   PLT 196 04/04/2017 0527   PLT 219 10/02/2013 1006   MCV 82.8 04/04/2017 0527   MCV 86 10/02/2013 1006   MCH 27.6 04/04/2017 0527   MCHC 33.3 04/04/2017 0527   RDW 14.2 04/04/2017 0527   RDW 14.5 10/02/2013 1006   Iron Studies    Component Value Date/Time   IRON 62 10/02/2013 1006   TIBC 425 10/02/2013 1006   FERRITIN 7 (L) 10/02/2013 1006   IRONPCTSAT 15 10/02/2013 1006   Lipid Panel     Component Value Date/Time   CHOL 215 (H) 04/24/2023 1002   TRIG 125 04/24/2023 1002   HDL 57 04/24/2023 1002   LDLCALC 136 (H) 04/24/2023 1002   Hepatic Function Panel     Component Value Date/Time   PROT 7.1 04/24/2023 1002   PROT 8.0 10/02/2013 1006   ALBUMIN 3.9 04/24/2023 1002   ALBUMIN 3.3 (L) 10/02/2013 1006   AST 10 04/24/2023 1002   AST 20 10/02/2013 1006   ALT 13 04/24/2023 1002   ALT 23 10/02/2013 1006   ALKPHOS 77 04/24/2023 1002   ALKPHOS 77  10/02/2013 1006   BILITOT 0.6 04/24/2023 1002   BILITOT 0.4 10/02/2013 1006      Component Value Date/Time   TSH 1.800 04/24/2023 1002   Nutritional Lab Results  Component Value Date   VD25OH 7.1 (L) 04/24/2023     ASSESSMENT  AND PLAN  TREATMENT PLAN FOR OBESITY:  Recommended Dietary Goals  Sydney Hamilton is currently in the action stage of change. As such, her goal is to continue weight management plan. She has agreed to the Category 2 Plan.  Behavioral Intervention  We discussed the following Behavioral Modification Strategies today: increasing lean protein intake, decreasing simple carbohydrates , increasing vegetables, increasing lower glycemic fruits, increasing water intake, continue to practice mindfulness when eating, and planning for success.  Additional resources provided today:  Information given on prediabetes and Metformin.  Recommended Physical Activity Goals  Tyshonna has been advised to work up to 150 minutes of moderate intensity aerobic activity a week and strengthening exercises 2-3 times per week for cardiovascular health, weight loss maintenance and preservation of muscle mass.   She has agreed to Continue current level of physical activity , Think about ways to increase daily physical activity and overcoming barriers to exercise, and Increase physical activity in their day and reduce sedentary time (increase NEAT).   ASSOCIATED CONDITIONS ADDRESSED TODAY  Action/Plan  Essential hypertension Continue to follow up with PCP.  Continue meds as directed  Low vitamin B12 level Start Vit B12 OTC  Vitamin D deficiency -     start Vitamin D (Ergocalciferol); Take 1 capsule (50,000 Units total) by mouth every 7 (seven) days.  Dispense: 5 capsule; Refill: 0.  Side effects discussed.   Low Vitamin D level contributes to fatigue and are associated with obesity, breast, and colon cancer. She agrees to continue to take prescription Vitamin D @50 ,000 IU every week and  will follow-up for routine testing of Vitamin D, at least 2-3 times per year to avoid over-replacement.   Prediabetes Aurellia will continue to work on weight loss, exercise, and decreasing simple carbohydrates to help decrease the risk of diabetes.   Information given on prediabetes & Metformin   Hyperlipidemia  Cardiovascular risk and specific lipid/LDL goals reviewed.  We discussed several lifestyle modifications today and Raeley will continue to work on diet, exercise and weight loss efforts. Orders and follow up as documented in patient record.   Counseling Intensive lifestyle modifications are the first line treatment for this issue. Dietary changes: Increase soluble fiber. Decrease simple carbohydrates. Exercise changes: Moderate to vigorous-intensity aerobic activity 150 minutes per week if tolerated. Lipid-lowering medications: see documented in medical record.    Morbid obesity (HCC)  BMI 45.0-49.9, adult (HCC)     Labs reviewed in chart from 04/24/23    Return in about 2 weeks (around 05/22/2023).Marland Kitchen She was informed of the importance of frequent follow up visits to maximize her success with intensive lifestyle modifications for her multiple health conditions.   ATTESTASTION STATEMENTS:  Reviewed by clinician on day of visit: allergies, medications, problem list, medical history, surgical history, family history, social history, and previous encounter notes.     Theodis Sato. Jeslin Bazinet FNP-C

## 2023-05-11 DIAGNOSIS — E559 Vitamin D deficiency, unspecified: Secondary | ICD-10-CM | POA: Diagnosis not present

## 2023-05-11 DIAGNOSIS — J309 Allergic rhinitis, unspecified: Secondary | ICD-10-CM | POA: Diagnosis not present

## 2023-05-11 DIAGNOSIS — I10 Essential (primary) hypertension: Secondary | ICD-10-CM | POA: Diagnosis not present

## 2023-05-11 DIAGNOSIS — E7800 Pure hypercholesterolemia, unspecified: Secondary | ICD-10-CM | POA: Diagnosis not present

## 2023-05-22 ENCOUNTER — Ambulatory Visit: Payer: BC Managed Care – PPO | Admitting: Family Medicine

## 2023-05-31 ENCOUNTER — Encounter: Payer: Self-pay | Admitting: Nurse Practitioner

## 2023-05-31 ENCOUNTER — Telehealth: Payer: Self-pay

## 2023-05-31 ENCOUNTER — Ambulatory Visit: Payer: BC Managed Care – PPO | Admitting: Nurse Practitioner

## 2023-05-31 VITALS — BP 132/82 | HR 74 | Temp 97.6°F | Ht 65.5 in | Wt 287.0 lb

## 2023-05-31 DIAGNOSIS — R7303 Prediabetes: Secondary | ICD-10-CM | POA: Diagnosis not present

## 2023-05-31 DIAGNOSIS — E559 Vitamin D deficiency, unspecified: Secondary | ICD-10-CM | POA: Diagnosis not present

## 2023-05-31 DIAGNOSIS — Z6841 Body Mass Index (BMI) 40.0 and over, adult: Secondary | ICD-10-CM

## 2023-05-31 DIAGNOSIS — R7989 Other specified abnormal findings of blood chemistry: Secondary | ICD-10-CM

## 2023-05-31 DIAGNOSIS — R632 Polyphagia: Secondary | ICD-10-CM | POA: Diagnosis not present

## 2023-05-31 MED ORDER — VITAMIN D (ERGOCALCIFEROL) 1.25 MG (50000 UNIT) PO CAPS: 50000.0000 [IU] | ORAL_CAPSULE | ORAL | 0 refills | Status: DC

## 2023-05-31 MED ORDER — WEGOVY 0.25 MG/0.5ML ~~LOC~~ SOAJ: 0.2500 mg | SUBCUTANEOUS | 0 refills | Status: DC

## 2023-05-31 NOTE — Progress Notes (Signed)
Office: [phone]  /  Fax: [phone]  WEIGHT SUMMARY AND BIOMETRICS  Weight Lost Since Last Visit: 0lb  Weight Gained Since Last Visit: 0lb   Vitals Temp: 97.6 F (36.4 C) BP: 132/82 Pulse Rate: 74 SpO2: 100 %   Anthropometric Measurements Height: 5' 5.5" (1.664 m) Weight: 287 lb (130.2 kg) BMI (Calculated): 47.02 Weight at Last Visit: 287lb Weight Lost Since Last Visit: 0lb Weight Gained Since Last Visit: 0lb Starting Weight: 296lb Total Weight Loss (lbs): 9 lb (4.082 kg)   Body Composition  Body Fat %: 48 % Fat Mass (lbs): 1378 lbs Muscle Mass (lbs): 141.8 lbs Total Body Water (lbs): 98 lbs Visceral Fat Rating : 16   Other Clinical Data Fasting: Yes Labs: No Today's Visit #: 3 Starting Date: 04/24/23     HPI  Chief Complaint: OBESITY  Sydney Hamilton is here to discuss her progress with her obesity treatment plan. She is on the the Category 2 Plan and states she is following her eating plan approximately 50 % of the time. She states she is exercising 38-45 minutes 2 days per week.   Interval History:  Since last office visit she has maintained her weight.  She has had multiple celebrations since her last visit and has  a couple celebrations coming up. She has tried to make healthier choices.  She is drinking water and protein shakes off and on.  Notes polyphagia and cravings.      Pharmacotherapy for weight loss: She is not currently taking medications  for medical weight loss.   Previous pharmacotherapy for medical weight loss:  Phentermine-stopped due to palpitations.    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  Her highest weight prior to surgery was > 300 lbs and her nadir weight after surgery was 230 lbs. She reports some restriction.  She is taking a MV daily, Vit D weekly and Vit B12 daily.    Vit D deficiency  She is taking Vit D 50,000 IU weekly.  Started taking after her last visit on  05/08/23.  Denies side effects.  Denies nausea, vomiting or muscle weakness.    Lab Results  Component Value Date   VD25OH 7.1 (L) 04/24/2023     Prediabetes Last A1c was 6.0  Medication(s): never been on meds FH: mgm with DMT1 Polyphagia:Yes  Lab Results  Component Value Date   HGBA1C 6.0 (H) 04/24/2023   HGBA1C 5.8 10/02/2013   Lab Results  Component Value Date   INSULIN 18.7 04/24/2023   Low Vit B12 She is taking Vit B12 OTC.  Denies side effects.    PHYSICAL EXAM:  Blood pressure 132/82, pulse 74, temperature 97.6 F (36.4 C), height 5' 5.5" (1.664 m), weight 287 lb (130.2 kg), last menstrual period 02/21/2015, SpO2 100%. Body mass index is 47.03 kg/m.  General: She is overweight, cooperative, alert, well developed, and in no acute distress. PSYCH: Has normal mood, affect and thought process.   Extremities: No edema.  Neurologic: No gross sensory or motor deficits. No tremors or fasciculations noted.    DIAGNOSTIC DATA REVIEWED:  BMET    Component Value Date/Time   NA 134 04/24/2023 1002   NA 134 (L) 10/02/2013 1006   K 4.0 04/24/2023 1002   K 3.7 10/02/2013 1006   CL 99 04/24/2023 1002   CL 105 10/02/2013 1006   CO2 25 04/24/2023 1002   CO2 26 10/02/2013 1006   GLUCOSE 86 04/24/2023 1002  GLUCOSE 77 03/26/2017 1507   GLUCOSE 84 10/02/2013 1006   BUN 14 04/24/2023 1002   BUN 11 10/02/2013 1006   CREATININE 0.77 04/24/2023 1002   CREATININE 0.81 10/02/2013 1006   CALCIUM 9.7 04/24/2023 1002   CALCIUM 9.0 10/02/2013 1006   GFRNONAA >60 04/03/2017 1916   GFRNONAA >60 10/02/2013 1006   GFRAA >60 04/03/2017 1916   GFRAA >60 10/02/2013 1006   Lab Results  Component Value Date   HGBA1C 6.0 (H) 04/24/2023   HGBA1C 5.8 10/02/2013   Lab Results  Component Value Date   INSULIN 18.7 04/24/2023   Lab Results  Component Value Date   TSH 1.800 04/24/2023   CBC    Component Value Date/Time   WBC 8.5 04/04/2017 0527   RBC 4.31 04/04/2017 0527   HGB  11.9 (L) 04/04/2017 0527   HGB 12.7 10/02/2013 1006   HCT 35.7 (L) 04/04/2017 0527   HCT 38.4 10/02/2013 1006   PLT 196 04/04/2017 0527   PLT 219 10/02/2013 1006   MCV 82.8 04/04/2017 0527   MCV 86 10/02/2013 1006   MCH 27.6 04/04/2017 0527   MCHC 33.3 04/04/2017 0527   RDW 14.2 04/04/2017 0527   RDW 14.5 10/02/2013 1006   Iron Studies    Component Value Date/Time   IRON 62 10/02/2013 1006   TIBC 425 10/02/2013 1006   FERRITIN 7 (L) 10/02/2013 1006   IRONPCTSAT 15 10/02/2013 1006   Lipid Panel     Component Value Date/Time   CHOL 215 (H) 04/24/2023 1002   TRIG 125 04/24/2023 1002   HDL 57 04/24/2023 1002   LDLCALC 136 (H) 04/24/2023 1002   Hepatic Function Panel     Component Value Date/Time   PROT 7.1 04/24/2023 1002   PROT 8.0 10/02/2013 1006   ALBUMIN 3.9 04/24/2023 1002   ALBUMIN 3.3 (L) 10/02/2013 1006   AST 10 04/24/2023 1002   AST 20 10/02/2013 1006   ALT 13 04/24/2023 1002   ALT 23 10/02/2013 1006   ALKPHOS 77 04/24/2023 1002   ALKPHOS 77 10/02/2013 1006   BILITOT 0.6 04/24/2023 1002   BILITOT 0.4 10/02/2013 1006      Component Value Date/Time   TSH 1.800 04/24/2023 1002   Nutritional Lab Results  Component Value Date   VD25OH 7.1 (L) 04/24/2023     ASSESSMENT AND PLAN  TREATMENT PLAN FOR OBESITY:  Recommended Dietary Goals  Dianca is currently in the action stage of change. As such, her goal is to continue weight management plan. She has agreed to the Category 2 Plan.  Behavioral Intervention  We discussed the following Behavioral Modification Strategies today: increasing lean protein intake, decreasing simple carbohydrates , increasing vegetables, increasing lower glycemic fruits, increasing water intake, work on meal planning and preparation, reading food labels , keeping healthy foods at home, continue to practice mindfulness when eating, and planning for success.  Additional resources provided today: NA  Recommended Physical Activity  Goals  Campbell has been advised to work up to 150 minutes of moderate intensity aerobic activity a week and strengthening exercises 2-3 times per week for cardiovascular health, weight loss maintenance and preservation of muscle mass.   She has agreed to Think about ways to increase daily physical activity and overcoming barriers to exercise, Increase the intensity, frequency or duration of strengthening exercises , Increase the intensity, frequency or duration of aerobic exercises  , and Increase physical activity in their day and reduce sedentary time (increase NEAT).   Pharmacotherapy We  discussed various medication options to help Elona with her weight loss efforts and we both agreed to start [facility] 0.25mg .  side effects discussed.  Patient has had a hysterectomy.  Contraindications:  Pancreatitis (active gallstones) Medullary thyroid cancer High triglycerides (>500)-will need labs prior to starting Multiple Endocrine Neoplasia syndrome type 2 (MEN 2) Trying to get pregnant Breastfeeding Use with caution with taking insulin or sulfonylureas (will need to monitor blood sugars for hypoglycemia)  ASSOCIATED CONDITIONS ADDRESSED TODAY  Action/Plan  Vitamin D deficiency -     Vitamin D (Ergocalciferol); Take 1 capsule (50,000 Units total) by mouth every 7 (seven) days.  Dispense: 5 capsule; Refill: 0  Low Vitamin D level contributes to fatigue and are associated with obesity, breast, and colon cancer. She agrees to continue to take prescription Vitamin D @50 ,000 IU every week and will follow-up for routine testing of Vitamin D, at least 2-3 times per year to avoid over-replacement.   Prediabetes Doris will continue to work on weight loss, exercise, and decreasing simple carbohydrates to help decrease the risk of diabetes.    Consider Metformin in the future.    Low vitamin B12 level Continue vitamin B12 OTC.  Morbid obesity (HCC) -     UJWJXB; Inject 0.25 mg into the skin once a  week.  Dispense: 2 mL; Refill: 0  BMI 45.0-49.9, adult (HCC)  Polyphagia -     JYNWGN; Inject 0.25 mg into the skin once a week.  Dispense: 2 mL; Refill: 0         No follow-ups on file.Marland Kitchen She was informed of the importance of frequent follow up visits to maximize her success with intensive lifestyle modifications for her multiple health conditions.   ATTESTASTION STATEMENTS:  Reviewed by clinician on day of visit: allergies, medications, problem list, medical history, surgical history, family history, social history, and previous encounter notes.     Theodis Sato. Nickoli Bagheri FNP-C

## 2023-05-31 NOTE — Telephone Encounter (Signed)
PA submitted through Cover My Meds for [facility]. Awaiting insurance determination. Key: [id number]

## 2023-05-31 NOTE — Telephone Encounter (Signed)
Received through Cover My Meds:  WUJWJX:[id number];AOZHYQ:[id number];Review Type:Prior Auth;Coverage Start Date:05/01/2023;Coverage End Date:12/27/2023;

## 2023-05-31 NOTE — Patient Instructions (Signed)
Steps to starting your WegovyT  The office staff will send a prior authorization request to your insurance company for approval. We will send you a mychart message once we hear back from your insurance with a decision.  This can take up to 7-10 business days.   Once your WegovyTis approved, you may then pick up Wegovy pen from your pharmacy.    Learn how to do Wegovy injections on the Wegovy.com website. There is a training video that will walk you through how to safely perform the injection. If you have questions for our clinical staff, please contact our  clinical staff. If you have any symptoms of allergic reaction to WegovyT discontinue immediately and call 911.  1. What should I tell my provider before using WegovyT ? have or have had problems with your pancreas or kidneys. have type 2 diabetes and a history of diabetic retinopathy. have or have had depression, suicidal thoughts, or mental health issues. are pregnant or plan to become pregnant. WegovyT may harm your unborn baby. You should stop using WegovyT 3 months before you plan to become pregnant or if you are breastfeeding or plan to breastfeed. It is not known if WegovyT passes into your breast milk.  2. What is WegovyT and how does it work?  WegovyT is an injectable prescription medication prescribed by your provider to help with your weight loss.  This medicine will be most effective when combined with a reduced calorie diet and physical activity.  WegovyT is not for the treatment of type 2 diabetes mellitus. WegovyT should not be used with other GLP-1 receptor agonist medicines. The addition of WegovyT in  patients treated with insulin has not been evaluated. When initiating WegovyT, consider reducing the dose of concomitantly administered insulin secretagogues (such as sulfonylureas) or insulin to reduce the risk of  hypoglycemia.  One role of GLP-1 is to send a signal to your brain to tell it you are full. It also slows down  stomach emptying which will make you feel full longer and may help with reducing cravings.   3.  How should I take WegovyT?  Administer WegovyT once weekly, on the same day each week, at any time of day, with or without meals Inject subcutaneously in the abdomen, thigh or upper arm Initiate at 0.25 mg once weekly for 4 weeks. In 4 week intervals, increase the dose until a dose of 2.4 mg is reached (we will discuss with you the dosage at each visit). The maintenance dose of WegovyT is 2.4 mg once weekly.  The dosing schedule of Wegovy is:  0.25 mg per week X 4 weeks 0.5 mg per week X 4 weeks 1.0 mg per week X 4 weeks 1.7 mg per week X 4 weeks 2.4 mg per week   Missed dose   If you miss your injection day, go ahead inject your current dose. You can go >7 days, but not <7 days between injections. You may change your injection day (It must be >7 days). If you miss >2 doses, you can still keep next injection dose the same or follow de-escalation schedule which may minimize GI symptoms.   In patients with type 2 diabetes, monitor blood glucose prior to starting and during WEGOVYT treatment.   Inject your dose of Wegovy under the skin (subcutaneous injection) in your stomach area (abdomen), upper leg (thigh) or upper arm. Do not inject into a vein or a muscle. The injection site should be rotated and not given in the   same spot each day. Hold the needle under the skin and count to "10". This will allow all of the medicine to be dispensed under the skin. Always wipe your skin with an alcohol prep pad before injection  Dispose of used pen in an approved sharps container. More practical options that can be put in the trash  to go to the landfill are milk jugs or plastic laundry detergent containers with a screw on lid.  What side effects may I notice from taking WegovyT?  Side effects that usually do not require medical attention (report to our office if they continue or are bothersome): Nausea  (most common but decreases over time in most people as their body gets used to the medicine) Diarrhea Constipation (you may take an over the counter laxative if needed) Headache Decreased appetite Upset stomach Tiredness Dizziness Feeling bloated Hair loss Belching Gas Heartburn  Side effects that you should call 911 as soon as possible Vomiting Stomach pain Fever Yellowing of your skin or eyes  Clay-colored stools Increased heart rate while at rest Low blood sugar  Sudden changes in mood, behaviors, thoughts, feelings, or thoughts of suicide If you get a lump or swelling in your neck, hoarseness, trouble swallowing, or shortness of breath. Allergic reaction such as skin rash, itching, hives, swelling of the face, tongue, or lips  Helpful tips for managing nausea Nausea is a common side effect when first starting WegovyT. If you experience nausea, be sure to connect with your health care provider. He or she will offer guidance on ways to manage it, which may include: Eat bland, low-fat foods, like crackers, toast and rice  Eat foods that contain water, like soups and gelatin  Avoid lying down after you eat  Go outdoors for fresh air  Eat more slowly    Other important information Do not drop your pen or knock it against hard surfaces  Do not expose your pen to any liquids  If you think that your pen may be damaged, do not try to fix it. Use a new one Keep the pen cap on until you are ready to inject. Your pen will no longer be sterile if you store an unused pen without the cap, if you pull the pen cap off and put it on again, or if the pen cap is missing. This could lead to an infection  Store the WegovyT pen in the refrigerator from 36F to 46F (2C to 8C) If needed, before removing the pen cap, WegovyT can be stored from 8C to 30C (46F to 86F) in the original carton for up to 28 days.  Keep WegovyT in the original carton to protect it from light  Do not freeze   Throw away pen if WegovyT has been frozen, has been exposed to light or temperatures above 86F (30C), or has been out of the refrigerator for 28 days or longer It's important to properly dispose of your used WegovyT pens. Do not throw the pen away in your household trash. Instead, use an FDA-cleared sharps disposable container or a sturdy household container with a tight-fitting lid, like a heavy duty plastic container.   Wegovy pen training website: https://www.wegovy.com/about-wegovy/how-to-use-the-wegovy-pen.html  Wegovy savings and support link: https://www.wegovy.com/saving-and-support/save-and-support.html    What is a GLP-1 Glucagon like peptide-1 (GLP-1) agonists represent a class of medications used to treat type 2 diabetes mellitus and obesity.  GLP-1 medications mimic the action of a hormone called glucagon like peptide 1.  When blood sugar levels start to   rise/increase these drugs stimulate the body to produce more insulin.  When that happens, the extra insulin helps to lower the blood sugar levels in the body.  This in returns helps with decreasing cravings.  These medications also slow the movement of food from the stomach into the small intestine.  This in return helps one to full faster and longer.   Diabetic medications: Approved for treatment of diabetes mellitus but does not have full approval for weight loss use Victoza (liraglutide) Ozempic (semaglutide) Mounjaro Trulicity Rybelsus  Weight loss medications: Approved for long-term weight loss use.        Saxenda (liraglutide) Wegovy (semaglutide) Zepbound Contraindications:  Pancreatitis (active gallstones) Medullary thyroid cancer High triglycerides (>500)-will need labs prior to starting Multiple Endocrine Neoplasia syndrome type 2 (MEN 2) Trying to get pregnant Breastfeeding Use with caution with taking insulin or sulfonylureas (will need to monitor blood sugars for hypoglycemia) Side effects (most  common): Most common side effects are nausea, gas, bloating and constipation.  Other possible side effects are headaches, belching, diarrhea, tiredness (fatigue), vomiting, upset stomach, dizziness, heartburn and stomach (abdominal pain).  If you think that you are becoming dehydrated, please inform our office or your primary family provider.  Stop immediately and go to ER if you have any symptoms of a serious allergic reaction including swelling of your face, lips, tongue or throat; problems breathing or swallowing; severe rash or itching; fainting or feeling dizzy; or very rapid heart rate.                                                                                             

## 2023-06-04 ENCOUNTER — Telehealth (INDEPENDENT_AMBULATORY_CARE_PROVIDER_SITE_OTHER): Payer: BC Managed Care – PPO | Admitting: Psychology

## 2023-06-04 DIAGNOSIS — F5089 Other specified eating disorder: Secondary | ICD-10-CM | POA: Diagnosis not present

## 2023-06-04 NOTE — Progress Notes (Signed)
Office: [phone]  /  Fax: [phone]    Date: June 04, 2023    Appointment Start Time: 12:01pm Duration: 42 minutes Provider: Lawerance Cruel, Psy.D. Type of Session: Intake for Individual Therapy  Location of Patient: Home (private location) Location of Provider: Provider's home (private office) Type of Contact: Telepsychological Visit via MyChart Video Visit  Informed Consent: Prior to proceeding with today's appointment, two pieces of identifying information were obtained. In addition, [name]'s physical location at the time of this appointment was obtained as well a phone number she could be reached at in the event of technical difficulties. [name] and this provider participated in today's telepsychological service.   The provider's role was explained to Sydney Hamilton. The provider reviewed and discussed issues of confidentiality, privacy, and limits therein (e.g., reporting obligations). In addition to verbal informed consent, written informed consent for psychological services was obtained prior to the initial appointment. Since the clinic is not a 24/7 crisis center, mental health emergency resources were shared and this  provider explained MyChart, e-mail, voicemail, and/or other messaging systems should be utilized only for non-emergency reasons. This provider also explained that information obtained during appointments will be placed in Sydney Hamilton's medical record and relevant information will be shared with other providers at [facility] at any locations for coordination of care. Sydney Hamilton agreed information may be shared with other [facility] providers as needed for coordination of care and by signing the service agreement document, she provided written consent for coordination of care. Prior to initiating telepsychological services, Sydney Hamilton completed an informed consent document, which included the development of a safety plan (i.e., an emergency  contact and emergency resources) in the event of an emergency/crisis. Sydney Hamilton verbally acknowledged understanding she is ultimately responsible for understanding her insurance benefits for telepsychological and in-person services. This provider also reviewed confidentiality, as it relates to telepsychological services. Sydney Hamilton  acknowledged understanding that appointments cannot be recorded without both party consent and she is aware she is responsible for securing confidentiality on her end of the session. Sydney Hamilton verbally consented to proceed.  Chief Complaint/HPI: Sydney Hamilton was referred by Dr. Corinna Capra due to other disorder of eating. Per the note for the visit with Dr. Corinna Capra on 04/24/2023, "Patient is not on medications currently."   During today's appointment, Charolette was verbally administered a questionnaire assessing various behaviors related to emotional eating behaviors. Sydney Hamilton endorsed the following: overeat when you are celebrating, eat certain foods when you are anxious, stressed, depressed, or your feelings are hurt, use food to help you cope with emotional situations, find food is comforting to you, not worry about what you eat when you are in a good mood, and eat as a reward. Sydney Hamilton believes the onset of emotional eating behaviors was likely "within the last 10-15 years," noting she experienced an increase in responsibility at work and a "bad break up." She described engaging in binge eating behaviors during that time (e.g., consuming an entire pizza in one sitting). Sydney Hamilton reported she last engaged in binge eating behaviors "years ago." She stated she is unsure of the current frequency of emotional eating behaviors. [name] denied a history of significantly restricting food intake, purging and engagement in other compensatory strategies for weight loss, and has never been diagnosed with an eating disorder. Furthermore, [name] reported she has gastric sleeve surgery in 2018, noting the weight  started "slowly coming on" in the past few years." She stated she discussed emotional eating behaviors prior to bariatric surgery with  a mental health provider.  Mental Status Examination:  Appearance: neat Behavior: appropriate to circumstances Mood: neutral Affect: mood congruent Speech: WNL Eye Contact: appropriate Psychomotor Activity: WNL Gait: unable to assess  Thought Process: linear, logical, and goal directed and denies suicidal, homicidal, and self-harm ideation, plan and intent  Thought Content/Perception: no hallucinations, delusions, bizarre thinking or behavior endorsed or observed Orientation: AAOx4 Memory/Concentration: intact Insight/Judgment: fair  Family & Psychosocial History: Isola reported she is married. 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 Additionally, Aaralynn shared her highest level of education obtained is a bachelor's degree. Currently, [name]'s social support system consists of her husband, aunts, and best girlfriends. Moreover, Sydney Hamilton stated she resides with her husband, "bonus son (age 34)," and niece (age 25).   Medical History:  Past Medical History:  Diagnosis Date   Acne    Dr. Terri Piedra   Anemia    history of due to fibroids   Asthma    allergy induced   Fibroids    Dr. Jackelyn Knife   GERD (gastroesophageal reflux disease)    History of hiatal hernia    Hypertension    Morbid obesity (HCC)    Sleep apnea    uses CPAP   Past Surgical History:  Procedure Laterality Date   LAPAROSCOPIC GASTRIC SLEEVE RESECTION N/A 04/03/2017   Procedure: LAPAROSCOPIC GASTRIC SLEEVE RESECTION, UPPER ENDO;  Surgeon: Luretha Murphy, MD;  Location: [facility];  Service: General;  Laterality: N/A;   LAPAROSCOPIC HYSTERECTOMY N/A 03/03/2015   Procedure: HYSTERECTOMY TOTAL LAPAROSCOPIC;  Surgeon: Lavina Hamman, MD;  Location: [facility];  Service: Gynecology;  Laterality: N/A;   REPAIR VAGINAL CUFF N/A 03/03/2015   Procedure:  REPAIR VAGINAL CUFF;  Surgeon: Lavina Hamman, MD;  Location: [facility];  Service: Gynecology;  Laterality: N/A;   WISDOM TOOTH EXTRACTION     Current Outpatient Medications on File Prior to Visit  Medication Sig Dispense Refill   acetaminophen (TYLENOL) 500 MG tablet Take 1,000 mg by mouth every 8 (eight) hours as needed for headache.     albuterol (PROVENTIL HFA;VENTOLIN HFA) 108 (90 BASE) MCG/ACT inhaler Inhale 2 puffs into the lungs every 6 (six) hours as needed for wheezing or shortness of breath.     Alpha-D-Galactosidase (BEANO PO) Take 1 capsule by mouth daily as needed.     ALPRAZolam (XANAX) 0.25 MG tablet Take 0.25 mg by mouth at bedtime as needed for anxiety.     amLODipine (NORVASC) 5 MG tablet Take 5 mg by mouth daily. Amlodipine Besylate 5 MG Tablet 1 tablet Once a day     diphenhydrAMINE (BENADRYL) 25 mg capsule Take 25 mg by mouth daily as needed for allergies.     doxycycline (VIBRAMYCIN) 100 MG capsule Take 100 mg by mouth daily as needed (for acne breakouts).     famotidine (PEPCID) 20 MG tablet Take 20 mg by mouth daily as needed for heartburn or indigestion.     fexofenadine (ALLEGRA) 180 MG tablet Take 180 mg by mouth daily.     hydrochlorothiazide (HYDRODIURIL) 25 MG tablet Take 25 mg by mouth daily. Hydrochlorothiazide 25 MG Tablet 1 tablet Once a day     Liniments (SALONPAS PAIN RELIEF PATCH EX) Place 1 patch onto the skin daily as needed (pain).     mometasone-formoterol (DULERA) 100-5 MCG/ACT AERO Inhale 2 puffs into the lungs 2 (two) times daily as needed for wheezing or shortness of breath.     Multiple Vitamins-Minerals (MULTIVITAMIN WITH MINERALS) tablet  Take 1 tablet by mouth daily.     potassium chloride SA (K-DUR,KLOR-CON) 20 MEQ tablet Take 20 mEq by mouth 2 (two) times daily.     promethazine-dextromethorphan (PROMETHAZINE-DM) 6.25-15 MG/5ML syrup Take 5 mLs by mouth 4 (four) times daily as needed for cough. (Patient not taking: Reported on 05/31/2023) 118 mL 0    Semaglutide-Weight Management (WEGOVY) 0.25 MG/0.5ML SOAJ Inject 0.25 mg into the skin once a week. 2 mL 0   Simethicone 180 MG CAPS Take 1 capsule by mouth daily as needed.     valACYclovir (VALTREX) 1000 MG tablet Take 500 mg by mouth daily as needed (for outbreaks).     Vitamin D, Ergocalciferol, (DRISDOL) 1.25 MG (50000 UNIT) CAPS capsule Take 1 capsule (50,000 Units total) by mouth every 7 (seven) days. 5 capsule 0   No current facility-administered medications on file prior to visit.  Shira stated she is medication compliant.   Mental Health History: Shaunna reported she attended therapeutic services during a break up 10-15 years ago. She stated her PCP currently prescribes Xanax PRN. Kirby reported there is no history of hospitalizations for psychiatric concerns. Altheria endorsed a family history of alcoholism and substance abuse (mother; deceased). Furthermore, Janaiyah reported there is no history of trauma including psychological, physical , and sexual abuse, as well as neglect.   Anetha described her typical mood lately as "pretty calm," adding, "I'm very much involved in our church activities." She noted a history of panic attacks, noting the last time was a few weeks ago. She shared she copes by engaging in deep breathing and tai chi. Camillia denied current alcohol use. She denied tobacco use. She denied illicit/recreational substance use. Furthermore, Jailani indicated she is not experiencing the following: hallucinations and delusions, paranoia, symptoms of mania , social withdrawal, crying spells, memory concerns, attention and concentration issues, and obsessions and compulsions. She also denied history of and current suicidal ideation, plan, and intent; history of and current homicidal ideation, plan, and intent; and history of and current engagement in self-harm.  Legal History: Terryann reported there is no history of legal involvement.   Structured Assessments Results: The Patient Health  Questionnaire-9 (PHQ-9) is a self-report measure that assesses symptoms and severity of depression over the course of the last two weeks. Azelie obtained a score of 1 suggesting minimal depression. Landyn finds the endorsed symptoms to be not difficult at all. [0= Not at all; 1= Several days; 2= More than half the days; 3= Nearly every day] Little interest or pleasure in doing things 0  Feeling down, depressed, or hopeless 0  Trouble falling or staying asleep, or sleeping too much 0  Feeling tired or having little energy-noted her B12 and VitD were "severely low" 1  Poor appetite or overeating 0  Feeling bad about yourself --- or that you are a failure or have let yourself or your family down 0  Trouble concentrating on things, such as reading the newspaper or watching television 0  Moving or speaking so slowly that other people could have noticed? Or the opposite --- being so fidgety or restless that you have been moving around a lot more than usual 0  Thoughts that you would be better off dead or hurting yourself in some way 0  PHQ-9 Score 1    The Generalized Anxiety Disorder-7 (GAD-7) is a brief self-report measure that assesses symptoms of anxiety over the course of the last two weeks. Elena obtained a score of 0. [0= Not at all; 1=  Several days; 2= Over half the days; 3= Nearly every day] Feeling nervous, anxious, on edge 0  Not being able to stop or control worrying 0  Worrying too much about different things 0  Trouble relaxing 0  Being so restless that it's hard to sit still 0  Becoming easily annoyed or irritable 0  Feeling afraid as if something awful might happen 0  GAD-7 Score 0   Interventions:  Conducted a chart review Focused on rapport building Verbally administered PHQ-9 and GAD-7 for symptom monitoring Verbally administered Food & Mood questionnaire to assess various behaviors related to emotional eating Provided emphatic reflections and validation Psychoeducation  provided regarding physical versus emotional hunger  Diagnostic Impressions & Provisional DSM-5 Diagnosis(es): Amberleigh shared a history of engagement in emotional and binge eating behaviors starting 10-15 years ago. She noted she is no longer engaging in binge eating behaviors and is unsure regarding the current frequency of engagement in emotional eating behaviors. She denied engagement in any other disordered eating behaviors. Based on the aforementioned, the following diagnosis was assigned: F50.89 Other Specified Feeding or Eating Disorder, Emotional Eating Behaviors.  Plan: Greta appears able and willing to participate as evidenced by engagement in reciprocal conversation and asking questions as needed for clarification. The next appointment is scheduled for 06/25/2023 at 2pm, which will be via MyChart Video Visit. The following treatment goal was established: increase coping skills. This provider will regularly review the treatment plan and medical chart to keep informed of status changes. Jaliana expressed understanding and agreement with the initial treatment plan of care. Lizanne will be sent a handout via e-mail to utilize between now and the next appointment to increase awareness of hunger patterns and subsequent eating. Desare provided verbal consent during today's appointment for this provider to send the handout via e-mail.

## 2023-06-13 DIAGNOSIS — R6883 Chills (without fever): Secondary | ICD-10-CM | POA: Diagnosis not present

## 2023-06-13 DIAGNOSIS — R52 Pain, unspecified: Secondary | ICD-10-CM | POA: Diagnosis not present

## 2023-06-13 DIAGNOSIS — U071 COVID-19: Secondary | ICD-10-CM | POA: Diagnosis not present

## 2023-06-13 DIAGNOSIS — R5383 Other fatigue: Secondary | ICD-10-CM | POA: Diagnosis not present

## 2023-06-13 DIAGNOSIS — R051 Acute cough: Secondary | ICD-10-CM | POA: Diagnosis not present

## 2023-06-21 ENCOUNTER — Ambulatory Visit: Payer: BC Managed Care – PPO | Admitting: Nurse Practitioner

## 2023-06-24 ENCOUNTER — Encounter (INDEPENDENT_AMBULATORY_CARE_PROVIDER_SITE_OTHER): Payer: Self-pay

## 2023-06-25 ENCOUNTER — Telehealth (INDEPENDENT_AMBULATORY_CARE_PROVIDER_SITE_OTHER): Payer: BC Managed Care – PPO | Admitting: Psychology

## 2023-08-16 DIAGNOSIS — R300 Dysuria: Secondary | ICD-10-CM | POA: Diagnosis not present

## 2023-08-16 DIAGNOSIS — N644 Mastodynia: Secondary | ICD-10-CM | POA: Diagnosis not present

## 2023-08-23 ENCOUNTER — Encounter: Payer: Self-pay | Admitting: Nurse Practitioner

## 2023-08-23 ENCOUNTER — Ambulatory Visit: Payer: BC Managed Care – PPO | Admitting: Nurse Practitioner

## 2023-08-23 VITALS — BP 134/86 | HR 72 | Temp 97.7°F | Ht 65.5 in | Wt 285.0 lb

## 2023-08-23 DIAGNOSIS — E559 Vitamin D deficiency, unspecified: Secondary | ICD-10-CM

## 2023-08-23 DIAGNOSIS — Z6841 Body Mass Index (BMI) 40.0 and over, adult: Secondary | ICD-10-CM

## 2023-08-23 DIAGNOSIS — R632 Polyphagia: Secondary | ICD-10-CM | POA: Diagnosis not present

## 2023-08-23 MED ORDER — VITAMIN D (ERGOCALCIFEROL) 1.25 MG (50000 UNIT) PO CAPS: 50000.0000 [IU] | ORAL_CAPSULE | ORAL | 0 refills | Status: DC

## 2023-08-23 MED ORDER — WEGOVY 0.25 MG/0.5ML ~~LOC~~ SOAJ: 0.2500 mg | SUBCUTANEOUS | 0 refills | Status: DC

## 2023-08-23 NOTE — Patient Instructions (Signed)
Steps to starting your WegovyT  The office staff will send a prior authorization request to your insurance company for approval. We will send you a mychart message once we hear back from your insurance with a decision.  This can take up to 7-10 business days.   Once your WegovyTis approved, you may then pick up Wegovy pen from your pharmacy.    Learn how to do Wegovy injections on the Wegovy.com website. There is a training video that will walk you through how to safely perform the injection. If you have questions for our clinical staff, please contact our  clinical staff. If you have any symptoms of allergic reaction to WegovyT discontinue immediately and call 911.  1. What should I tell my provider before using WegovyT ? have or have had problems with your pancreas or kidneys. have type 2 diabetes and a history of diabetic retinopathy. have or have had depression, suicidal thoughts, or mental health issues. are pregnant or plan to become pregnant. WegovyT may harm your unborn baby. You should stop using WegovyT 3 months before you plan to become pregnant or if you are breastfeeding or plan to breastfeed. It is not known if WegovyT passes into your breast milk.  2. What is WegovyT and how does it work?  WegovyT is an injectable prescription medication prescribed by your provider to help with your weight loss.  This medicine will be most effective when combined with a reduced calorie diet and physical activity.  WegovyT is not for the treatment of type 2 diabetes mellitus. WegovyT should not be used with other GLP-1 receptor agonist medicines. The addition of WegovyT in  patients treated with insulin has not been evaluated. When initiating WegovyT, consider reducing the dose of concomitantly administered insulin secretagogues (such as sulfonylureas) or insulin to reduce the risk of  hypoglycemia.  One role of GLP-1 is to send a signal to your brain to tell it you are full. It also slows down  stomach emptying which will make you feel full longer and may help with reducing cravings.   3.  How should I take WegovyT?  Administer WegovyT once weekly, on the same day each week, at any time of day, with or without meals Inject subcutaneously in the abdomen, thigh or upper arm Initiate at 0.25 mg once weekly for 4 weeks. In 4 week intervals, increase the dose until a dose of 2.4 mg is reached (we will discuss with you the dosage at each visit). The maintenance dose of WegovyT is 2.4 mg once weekly.  The dosing schedule of Wegovy is:  0.25 mg per week X 4 weeks 0.5 mg per week X 4 weeks 1.0 mg per week X 4 weeks 1.7 mg per week X 4 weeks 2.4 mg per week   Missed dose   If you miss your injection day, go ahead inject your current dose. You can go >7 days, but not <7 days between injections. You may change your injection day (It must be >7 days). If you miss >2 doses, you can still keep next injection dose the same or follow de-escalation schedule which may minimize GI symptoms.   In patients with type 2 diabetes, monitor blood glucose prior to starting and during WEGOVYT treatment.   Inject your dose of Wegovy under the skin (subcutaneous injection) in your stomach area (abdomen), upper leg (thigh) or upper arm. Do not inject into a vein or a muscle. The injection site should be rotated and not given in the   same spot each day. Hold the needle under the skin and count to "10". This will allow all of the medicine to be dispensed under the skin. Always wipe your skin with an alcohol prep pad before injection  Dispose of used pen in an approved sharps container. More practical options that can be put in the trash  to go to the landfill are milk jugs or plastic laundry detergent containers with a screw on lid.  What side effects may I notice from taking WegovyT?  Side effects that usually do not require medical attention (report to our office if they continue or are bothersome): Nausea  (most common but decreases over time in most people as their body gets used to the medicine) Diarrhea Constipation (you may take an over the counter laxative if needed) Headache Decreased appetite Upset stomach Tiredness Dizziness Feeling bloated Hair loss Belching Gas Heartburn  Side effects that you should call 911 as soon as possible Vomiting Stomach pain Fever Yellowing of your skin or eyes  Clay-colored stools Increased heart rate while at rest Low blood sugar  Sudden changes in mood, behaviors, thoughts, feelings, or thoughts of suicide If you get a lump or swelling in your neck, hoarseness, trouble swallowing, or shortness of breath. Allergic reaction such as skin rash, itching, hives, swelling of the face, tongue, or lips  Helpful tips for managing nausea Nausea is a common side effect when first starting WegovyT. If you experience nausea, be sure to connect with your health care provider. He or she will offer guidance on ways to manage it, which may include: Eat bland, low-fat foods, like crackers, toast and rice  Eat foods that contain water, like soups and gelatin  Avoid lying down after you eat  Go outdoors for fresh air  Eat more slowly    Other important information Do not drop your pen or knock it against hard surfaces  Do not expose your pen to any liquids  If you think that your pen may be damaged, do not try to fix it. Use a new one Keep the pen cap on until you are ready to inject. Your pen will no longer be sterile if you store an unused pen without the cap, if you pull the pen cap off and put it on again, or if the pen cap is missing. This could lead to an infection  Store the WegovyT pen in the refrigerator from 36F to 46F (2C to 8C) If needed, before removing the pen cap, WegovyT can be stored from 8C to 30C (46F to 86F) in the original carton for up to 28 days.  Keep WegovyT in the original carton to protect it from light  Do not freeze   Throw away pen if WegovyT has been frozen, has been exposed to light or temperatures above 86F (30C), or has been out of the refrigerator for 28 days or longer It's important to properly dispose of your used WegovyT pens. Do not throw the pen away in your household trash. Instead, use an FDA-cleared sharps disposable container or a sturdy household container with a tight-fitting lid, like a heavy duty plastic container.   Wegovy pen training website: https://www.wegovy.com/about-wegovy/how-to-use-the-wegovy-pen.html  Wegovy savings and support link: https://www.wegovy.com/saving-and-support/save-and-support.html    

## 2023-08-23 NOTE — Progress Notes (Signed)
Office: [phone]  /  Fax: [phone]  WEIGHT SUMMARY AND BIOMETRICS  Weight Lost Since Last Visit: 2lb  Weight Gained Since Last Visit: 0lb   Vitals Temp: 97.7 F (36.5 C) BP: 134/86 Pulse Rate: 72 SpO2: 99 %   Anthropometric Measurements Height: 5' 5.5" (1.664 m) Weight: 285 lb (129.3 kg) BMI (Calculated): 46.69 Weight at Last Visit: 287lb Weight Lost Since Last Visit: 2lb Weight Gained Since Last Visit: 0lb Starting Weight: 296lb Total Weight Loss (lbs): 11 lb (4.99 kg)   Body Composition  Body Fat %: 52.5 % Fat Mass (lbs): 149.8 lbs Muscle Mass (lbs): 128.8 lbs Total Body Water (lbs): 98.8 lbs Visceral Fat Rating : 18   Other Clinical Data Fasting: No Labs: No Today's Visit #: 4 Starting Date: 04/24/23     HPI  Chief Complaint: OBESITY  Sydney Hamilton is here to discuss her progress with her obesity treatment plan. She is on the the Category 2 Plan and states she is following her eating plan approximately 0 % of the time. She states she is exercising 20 minutes 4 days per week.   Interval History:  Since last office visit on 05/31/23 she has lost 2 pounds.  Since her last visit, both her and her husband had Covid (on dialysis)-she is his care giver.  Her husband has been hospitalized due to peritonitis and was in the hospital until the week of Thanksgiving.  She wasn't able to follow the meal plan or take [facility] due to the above.  Due to stress and holidays, she has been stress eating. He has fully recovered and is currently on the transplant list for a kidney transplant. She is now able to focus on herself and getting back on track. She is drinking water and sugar free juice.    Pharmacotherapy for weight loss: She is not currently taking medications  for medical weight loss. She took [place] 0.25mg  for one month but wasn't able to continue increasing the dose due to Covid and her husband being hospitalized.  She also wasn't able to start when first approved  due to shortage.    MVHQIO:[id number];XLKGMW:[id number];Review Type:Prior Auth;Coverage Start Date:05/01/2023;Coverage End Date:12/27/2023;    Previous pharmacotherapy for medical weight loss:  Phentermine-stopped due to palpitations.     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  Her highest weight prior to surgery was > 300 lbs and her nadir weight after surgery was 230 lbs. She reports some restriction.  She is taking a MV daily, Vit D weekly and Vit B12 daily.   Vit D deficiency  She is taking Vit D 50,000 IU weekly (has been out for the past 2 weeks).  Denies side effects.  Denies nausea, vomiting or muscle weakness.    Lab Results  Component Value Date   VD25OH 7.1 (L) 04/24/2023     PHYSICAL EXAM:  Blood pressure 134/86, pulse 72, temperature 97.7 F (36.5 C), height 5' 5.5" (1.664 m), weight 285 lb (129.3 kg), last menstrual period 02/21/2015, SpO2 99%. Body mass index is 46.71 kg/m.  General: She is overweight, cooperative, alert, well developed, and in no acute distress. PSYCH: Has normal mood, affect and thought process.   Extremities: No edema.  Neurologic: No gross sensory or motor deficits. No tremors or fasciculations noted.    DIAGNOSTIC DATA REVIEWED:  BMET    Component Value Date/Time   NA 134 04/24/2023 1002   NA 134 (L) 10/02/2013 1006   K  4.0 04/24/2023 1002   K 3.7 10/02/2013 1006   CL 99 04/24/2023 1002   CL 105 10/02/2013 1006   CO2 25 04/24/2023 1002   CO2 26 10/02/2013 1006   GLUCOSE 86 04/24/2023 1002   GLUCOSE 77 03/26/2017 1507   GLUCOSE 84 10/02/2013 1006   BUN 14 04/24/2023 1002   BUN 11 10/02/2013 1006   CREATININE 0.77 04/24/2023 1002   CREATININE 0.81 10/02/2013 1006   CALCIUM 9.7 04/24/2023 1002   CALCIUM 9.0 10/02/2013 1006   GFRNONAA >60 04/03/2017 1916   GFRNONAA >60 10/02/2013 1006   GFRAA >60 04/03/2017 1916   GFRAA >60 10/02/2013 1006   Lab Results  Component Value Date    HGBA1C 6.0 (H) 04/24/2023   HGBA1C 5.8 10/02/2013   Lab Results  Component Value Date   INSULIN 18.7 04/24/2023   Lab Results  Component Value Date   TSH 1.800 04/24/2023   CBC    Component Value Date/Time   WBC 8.5 04/04/2017 0527   RBC 4.31 04/04/2017 0527   HGB 11.9 (L) 04/04/2017 0527   HGB 12.7 10/02/2013 1006   HCT 35.7 (L) 04/04/2017 0527   HCT 38.4 10/02/2013 1006   PLT 196 04/04/2017 0527   PLT 219 10/02/2013 1006   MCV 82.8 04/04/2017 0527   MCV 86 10/02/2013 1006   MCH 27.6 04/04/2017 0527   MCHC 33.3 04/04/2017 0527   RDW 14.2 04/04/2017 0527   RDW 14.5 10/02/2013 1006   Iron Studies    Component Value Date/Time   IRON 62 10/02/2013 1006   TIBC 425 10/02/2013 1006   FERRITIN 7 (L) 10/02/2013 1006   IRONPCTSAT 15 10/02/2013 1006   Lipid Panel     Component Value Date/Time   CHOL 215 (H) 04/24/2023 1002   TRIG 125 04/24/2023 1002   HDL 57 04/24/2023 1002   LDLCALC 136 (H) 04/24/2023 1002   Hepatic Function Panel     Component Value Date/Time   PROT 7.1 04/24/2023 1002   PROT 8.0 10/02/2013 1006   ALBUMIN 3.9 04/24/2023 1002   ALBUMIN 3.3 (L) 10/02/2013 1006   AST 10 04/24/2023 1002   AST 20 10/02/2013 1006   ALT 13 04/24/2023 1002   ALT 23 10/02/2013 1006   ALKPHOS 77 04/24/2023 1002   ALKPHOS 77 10/02/2013 1006   BILITOT 0.6 04/24/2023 1002   BILITOT 0.4 10/02/2013 1006      Component Value Date/Time   TSH 1.800 04/24/2023 1002   Nutritional Lab Results  Component Value Date   VD25OH 7.1 (L) 04/24/2023     ASSESSMENT AND PLAN  TREATMENT PLAN FOR OBESITY:  Recommended Dietary Goals  Sydney Hamilton is currently in the action stage of change. As such, her goal is to continue weight management plan. She has agreed to the Category 2 Plan.  Behavioral Intervention  We discussed the following Behavioral Modification Strategies today: increasing lean protein intake to established goals, increasing water intake , planning for success,  celebration eating strategies, and continue to work on maintaining a reduced calorie state, getting the recommended amount of protein, incorporating whole foods, making healthy choices, staying well hydrated and practicing mindfulness when eating..  Additional resources provided today: NA  Recommended Physical Activity Goals  Laresha has been advised to work up to 150 minutes of moderate intensity aerobic activity a week and strengthening exercises 2-3 times per week for cardiovascular health, weight loss maintenance and preservation of muscle mass.   She has agreed to Think about enjoyable ways to  increase daily physical activity and overcoming barriers to exercise, Increase physical activity in their day and reduce sedentary time (increase NEAT)., and Work on scheduling and tracking physical activity.    Pharmacotherapy We discussed various medication options to help River with her weight loss efforts and we both agreed to restart Wegovy 0.25mg .  Side effects discussed.  Patient has had a hysterectomy.   Contraindications:  Pancreatitis (active gallstones) Medullary thyroid cancer High triglycerides (>500)-will need labs prior to starting Multiple Endocrine Neoplasia syndrome type 2 (MEN 2) Trying to get pregnant Breastfeeding Use with caution with taking insulin or sulfonylureas (will need to monitor blood sugars for hypoglycemia)  ASSOCIATED CONDITIONS ADDRESSED TODAY  Action/Plan  Vitamin D deficiency -     Vitamin D (Ergocalciferol); Take 1 capsule (50,000 Units total) by mouth every 7 (seven) days.  Dispense: 5 capsule; Refill: 0.  Side effects discussed  Low Vitamin D level contributes to fatigue and are associated with obesity, breast, and colon cancer. She agrees to continue to take prescription Vitamin D @50 ,000 IU every week and will follow-up for routine testing of Vitamin D, at least 2-3 times per year to avoid over-replacement.   Polyphagia -     [id number]; Inject 0.25 mg  into the skin once a week.  Dispense: 2 mL; Refill: 0  Morbid obesity (HCC) -     ZOXWRU; Inject 0.25 mg into the skin once a week.  Dispense: 2 mL; Refill: 0  BMI 45.0-49.9, adult (HCC)         Return in about 4 weeks (around 09/20/2023).Marland Kitchen She was informed of the importance of frequent follow up visits to maximize her success with intensive lifestyle modifications for her multiple health conditions.   ATTESTASTION STATEMENTS:  Reviewed by clinician on day of visit: allergies, medications, problem list, medical history, surgical history, family history, social history, and previous encounter notes.    Theodis Sato. Dorethia Jeanmarie FNP-C

## 2023-09-10 DIAGNOSIS — M25562 Pain in left knee: Secondary | ICD-10-CM | POA: Diagnosis not present

## 2023-09-19 ENCOUNTER — Ambulatory Visit: Payer: BC Managed Care – PPO | Admitting: Nurse Practitioner

## 2023-09-27 ENCOUNTER — Ambulatory Visit (INDEPENDENT_AMBULATORY_CARE_PROVIDER_SITE_OTHER): Payer: BC Managed Care – PPO | Admitting: Nurse Practitioner

## 2023-09-27 ENCOUNTER — Telehealth: Payer: Self-pay | Admitting: Nurse Practitioner

## 2023-09-27 ENCOUNTER — Other Ambulatory Visit: Payer: Self-pay | Admitting: Nurse Practitioner

## 2023-09-27 ENCOUNTER — Encounter: Payer: Self-pay | Admitting: Nurse Practitioner

## 2023-09-27 VITALS — BP 115/82 | HR 74 | Temp 97.9°F | Ht 65.5 in | Wt 280.0 lb

## 2023-09-27 DIAGNOSIS — Z6841 Body Mass Index (BMI) 40.0 and over, adult: Secondary | ICD-10-CM

## 2023-09-27 DIAGNOSIS — K5903 Drug induced constipation: Secondary | ICD-10-CM | POA: Diagnosis not present

## 2023-09-27 DIAGNOSIS — R632 Polyphagia: Secondary | ICD-10-CM | POA: Diagnosis not present

## 2023-09-27 MED ORDER — WEGOVY 0.5 MG/0.5ML ~~LOC~~ SOAJ: 0.5000 mg | SUBCUTANEOUS | 0 refills | Status: DC

## 2023-09-27 MED ORDER — WEGOVY 0.25 MG/0.5ML ~~LOC~~ SOAJ: 0.2500 mg | SUBCUTANEOUS | 0 refills | Status: DC

## 2023-09-27 NOTE — Progress Notes (Signed)
Office: [phone]  /  Fax: [phone]  WEIGHT SUMMARY AND BIOMETRICS  Weight Lost Since Last Visit: 5lb  Weight Gained Since Last Visit: 0lb   Vitals Temp: 97.9 F (36.6 C) BP: 115/82 Pulse Rate: 74 SpO2: 100 %   Anthropometric Measurements Height: 5' 5.5" (1.664 m) Weight: 280 lb (127 kg) BMI (Calculated): 45.87 Weight at Last Visit: 285lb Weight Lost Since Last Visit: 5lb Weight Gained Since Last Visit: 0lb Starting Weight: 296lb Total Weight Loss (lbs): 16 lb (7.258 kg)   Body Composition  Body Fat %: 47.6 % Fat Mass (lbs): 133.4 lbs Muscle Mass (lbs): 139.4 lbs Total Body Water (lbs): 93.4 lbs Visceral Fat Rating : 16   Other Clinical Data Fasting: Yes Labs: No Today's Visit #: 5 Starting Date: 04/24/23     HPI  Chief Complaint: OBESITY  Sydney Hamilton is here to discuss her progress with her obesity treatment plan. She is on the the Category 2 Plan and states she is following her eating plan approximately 70 % of the time. She states she is exercising 30 minutes 2 days per week.   Interval History:  Since last office visit she has lost 5 pounds.  She slipped and twisted her knee 3 week ago and took 2 weeks off from exercising. She took prednisone for 6 days.   She recently started back exercising. She is drinking water daily.    Pharmacotherapy for weight loss: She is currently taking Wegovy 0.25mg  (x 4 weeks). Reports side effects of constipation.    ONGEXB:[id number];WNUUVO:[id number];Review Type:Prior Auth;Coverage Start Date:05/01/2023;Coverage End Date:12/27/2023;   Previous pharmacotherapy for medical weight loss:  Phentermine-stopped due to palpitations.     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  Her highest weight prior to surgery was > 300 lbs and her nadir weight after surgery was 230 lbs. She reports some restriction.  She is taking a MV daily, Vit D weekly and Vit B12 daily.       PHYSICAL EXAM:  Blood pressure 115/82, pulse 74, temperature 97.9 F (36.6 C), height 5' 5.5" (1.664 m), weight 280 lb (127 kg), last menstrual period 02/21/2015, SpO2 100%. Body mass index is 45.89 kg/m.  General: She is overweight, cooperative, alert, well developed, and in no acute distress. PSYCH: Has normal mood, affect and thought process.   Extremities: No edema.  Neurologic: No gross sensory or motor deficits. No tremors or fasciculations noted.    DIAGNOSTIC DATA REVIEWED:  BMET    Component Value Date/Time   NA 134 04/24/2023 1002   NA 134 (L) 10/02/2013 1006   K 4.0 04/24/2023 1002   K 3.7 10/02/2013 1006   CL 99 04/24/2023 1002   CL 105 10/02/2013 1006   CO2 25 04/24/2023 1002   CO2 26 10/02/2013 1006   GLUCOSE 86 04/24/2023 1002   GLUCOSE 77 03/26/2017 1507   GLUCOSE 84 10/02/2013 1006   BUN 14 04/24/2023 1002   BUN 11 10/02/2013 1006   CREATININE 0.77 04/24/2023 1002   CREATININE 0.81 10/02/2013 1006   CALCIUM 9.7 04/24/2023 1002   CALCIUM 9.0 10/02/2013 1006   GFRNONAA >60 04/03/2017 1916   GFRNONAA >60 10/02/2013 1006   GFRAA >60 04/03/2017 1916   GFRAA >60 10/02/2013 1006   Lab Results  Component Value Date   HGBA1C 6.0 (H) 04/24/2023   HGBA1C 5.8 10/02/2013   Lab Results  Component Value Date   INSULIN 18.7 04/24/2023   Lab Results  Component Value Date   TSH 1.800 04/24/2023   CBC    Component Value Date/Time   WBC 8.5 04/04/2017 0527   RBC 4.31 04/04/2017 0527   HGB 11.9 (L) 04/04/2017 0527   HGB 12.7 10/02/2013 1006   HCT 35.7 (L) 04/04/2017 0527   HCT 38.4 10/02/2013 1006   PLT 196 04/04/2017 0527   PLT 219 10/02/2013 1006   MCV 82.8 04/04/2017 0527   MCV 86 10/02/2013 1006   MCH 27.6 04/04/2017 0527   MCHC 33.3 04/04/2017 0527   RDW 14.2 04/04/2017 0527   RDW 14.5 10/02/2013 1006   Iron Studies    Component Value Date/Time   IRON 62 10/02/2013 1006   TIBC 425 10/02/2013 1006   FERRITIN 7 (L) 10/02/2013 1006    IRONPCTSAT 15 10/02/2013 1006   Lipid Panel     Component Value Date/Time   CHOL 215 (H) 04/24/2023 1002   TRIG 125 04/24/2023 1002   HDL 57 04/24/2023 1002   LDLCALC 136 (H) 04/24/2023 1002   Hepatic Function Panel     Component Value Date/Time   PROT 7.1 04/24/2023 1002   PROT 8.0 10/02/2013 1006   ALBUMIN 3.9 04/24/2023 1002   ALBUMIN 3.3 (L) 10/02/2013 1006   AST 10 04/24/2023 1002   AST 20 10/02/2013 1006   ALT 13 04/24/2023 1002   ALT 23 10/02/2013 1006   ALKPHOS 77 04/24/2023 1002   ALKPHOS 77 10/02/2013 1006   BILITOT 0.6 04/24/2023 1002   BILITOT 0.4 10/02/2013 1006      Component Value Date/Time   TSH 1.800 04/24/2023 1002   Nutritional Lab Results  Component Value Date   VD25OH 7.1 (L) 04/24/2023     ASSESSMENT AND PLAN  TREATMENT PLAN FOR OBESITY:  Recommended Dietary Goals  Muriah is currently in the action stage of change. As such, her goal is to continue weight management plan. She has agreed to the Category 2 Plan.  Behavioral Intervention  We discussed the following Behavioral Modification Strategies today: increasing lean protein intake to established goals, decreasing simple carbohydrates , increasing vegetables, increasing water intake , work on meal planning and preparation, reading food labels , keeping healthy foods at home, continue to practice mindfulness when eating, planning for success, and continue to work on maintaining a reduced calorie state, getting the recommended amount of protein, incorporating whole foods, making healthy choices, staying well hydrated and practicing mindfulness when eating..  Additional resources provided today: NA  Recommended Physical Activity Goals  Cathi has been advised to work up to 150 minutes of moderate intensity aerobic activity a week and strengthening exercises 2-3 times per week for cardiovascular health, weight loss maintenance and preservation of muscle mass.   She has agreed to Think about  enjoyable ways to increase daily physical activity and overcoming barriers to exercise, Increase physical activity in their day and reduce sedentary time (increase NEAT)., Increase the intensity, frequency or duration of strengthening exercises , and Increase the intensity, frequency or duration of aerobic exercises     Pharmacotherapy We discussed various medication options to help Nanna with her weight loss efforts and we both agreed to continue [facility] 0.25mg  due to side effects of constipation.  Will continue to monitor.  Side effects discussed.   ASSOCIATED CONDITIONS ADDRESSED TODAY  Action/Plan  Polyphagia -     ZOXWRU; Inject 0.25 mg into the skin once a week.  Dispense: 2 mL; Refill: 0  Drug induced constipation Patient to call and schedule colonoscopy-will stop  Reginal Lutes based upon when she is scheduled.  Increase fiber, increase water, increase exercise. Can start a probiotic.  If constipation worsens or persist to let us know.    Morbid obesity (HCC) -     WUJWJX; Inject 0.25 mg into the skin once a week.  Dispense: 2 mL; Refill: 0  BMI 45.0-49.9, adult (HCC)         Return in about 4 weeks (around 10/25/2023).Marland Kitchen She was informed of the importance of frequent follow up visits to maximize her success with intensive lifestyle modifications for her multiple health conditions.   ATTESTASTION STATEMENTS:  Reviewed by clinician on day of visit: allergies, medications, problem list, medical history, surgical history, family history, social history, and previous encounter notes.     Theodis Sato. Marianita Botkin FNP-C

## 2023-09-27 NOTE — Telephone Encounter (Signed)
Patient stated the pharmacy is having problems with filling the medication. She asked can you please send in the next higher dosage, to see if her insurance will approve it.

## 2023-10-25 ENCOUNTER — Ambulatory Visit: Payer: BC Managed Care – PPO | Admitting: Bariatrics

## 2023-10-25 ENCOUNTER — Encounter: Payer: Self-pay | Admitting: Bariatrics

## 2023-10-25 VITALS — BP 132/87 | HR 77 | Temp 97.6°F | Ht 65.5 in | Wt 273.0 lb

## 2023-10-25 DIAGNOSIS — R632 Polyphagia: Secondary | ICD-10-CM

## 2023-10-25 DIAGNOSIS — I10 Essential (primary) hypertension: Secondary | ICD-10-CM

## 2023-10-25 DIAGNOSIS — Z6841 Body Mass Index (BMI) 40.0 and over, adult: Secondary | ICD-10-CM

## 2023-10-25 DIAGNOSIS — E559 Vitamin D deficiency, unspecified: Secondary | ICD-10-CM

## 2023-10-25 MED ORDER — WEGOVY 0.5 MG/0.5ML ~~LOC~~ SOAJ: 0.5000 mg | SUBCUTANEOUS | 0 refills | Status: DC

## 2023-10-25 MED ORDER — VITAMIN D (ERGOCALCIFEROL) 1.25 MG (50000 UNIT) PO CAPS: 50000.0000 [IU] | ORAL_CAPSULE | ORAL | 0 refills | Status: DC

## 2023-10-25 NOTE — Progress Notes (Signed)
 WEIGHT SUMMARY AND BIOMETRICS  Weight Lost Since Last Visit: 7lbs  Weight Gained Since Last Visit: 0   Vitals Temp: 97.6 F (36.4 C) BP: 132/87 Pulse Rate: 77 SpO2: 98 %   Anthropometric Measurements Height: 5' 5.5" (1.664 m) Weight: 273 lb (123.8 kg) BMI (Calculated): 44.72 Weight at Last Visit: 280lb Weight Lost Since Last Visit: 7lbs Weight Gained Since Last Visit: 0 Starting Weight: 296lb Total Weight Loss (lbs): 23 lb (10.4 kg)   Body Composition  Body Fat %: 44.4 % Fat Mass (lbs): 121.4 lbs Muscle Mass (lbs): 144.4 lbs Total Body Water (lbs): 96 lbs Visceral Fat Rating : 15   Other Clinical Data Fasting: no Labs: no Today's Visit #: 6 Starting Date: 04/24/23    OBESITY Alexia is here to discuss her progress with her obesity treatment plan along with follow-up of her obesity related diagnoses.    Nutrition Plan: the Category 2 plan - 75% adherence.  Current exercise: walking @ lunch for 30-40 mins 2 days a week and Gym 4 days a week for 60 mins or more.  Interim History:  She is down another 7 lbs since her last visit.  Eating all of the food on the plan., Protein intake is as prescribed, and Water intake is adequate.   Pharmacotherapy: Donicia is on Wegovy 0.50 mg SQ weekly Adverse side effects: Constipation Hunger is moderately controlled.  Cravings are moderately controlled.   Assessment/Plan:   1. Vitamin D deficiency Her energy is improving with the vitamin D.  Vitamin D is not at goal of 50.  Most recent vitamin D level was 7.1. She is on  prescription ergocalciferol 50,000 IU weekly. Lab Results  Component Value Date   VD25OH 7.1 (L) 04/24/2023    Plan: Refill prescription vitamin D 50,000 IU weekly.   Hypertension Hypertension reasonably well controlled.  Medication(s): Amlodipine 5 mg 1 daily   BP Readings from  Last 3 Encounters:  10/25/23 132/87  09/27/23 115/82  08/23/23 134/86   Lab Results  Component Value Date   CREATININE 0.77 04/24/2023   CREATININE 0.90 04/03/2017   CREATININE 0.68 03/26/2017   No results found for: "GFR"  Plan: Continue all antihypertensives at current dosages. No added salt. Will keep sodium content to 1,500 mg or less per day.    Polyphagia Sahiba endorses excessive hunger.  Medication(s): ZOXWRU Effects of medication:  moderately controlled. Cravings are moderately controlled.   Plan: Medication(s): Wegovy 0.50 mg SQ weekly Will increase water, protein and fiber to help assuage hunger.  Will minimize foods that have a high glucose index/load to minimize reactive hypoglycemia.  She will continue to adhere closely to the plan 85 to 95 %.     Morbid Obesity: Current BMI BMI (Calculated): 44.72   Pharmacotherapy Plan Continue and refill  Wegovy 0.50 mg SQ weekly  Daphnie is currently in the action  stage of change. As such, her goal is to continue with weight loss efforts.  She has agreed to the Category 2 plan.  Exercise goals: For substantial health benefits, adults should do at least 150 minutes (2 hours and 30 minutes) a week of moderate-intensity, or 75 minutes (1 hour and 15 minutes) a week of vigorous-intensity aerobic physical activity, or an equivalent combination of moderate- and vigorous-intensity aerobic activity. Aerobic activity should be performed in episodes of at least 10 minutes, and preferably, it should be spread throughout the week. She is now going to the gym on a regular basis.   Behavioral modification strategies: increasing lean protein intake, no meal skipping, meal planning , better snacking choices, planning for success, and increasing vegetables.  Catherene has agreed to follow-up with our clinic in 3 weeks.    Objective:   VITALS: Per patient if applicable, see vitals. GENERAL: Alert and in no acute distress. CARDIOPULMONARY:  No increased WOB. Speaking in clear sentences.  PSYCH: Pleasant and cooperative. Speech normal rate and rhythm. Affect is appropriate. Insight and judgement are appropriate. Attention is focused, linear, and appropriate.  NEURO: Oriented as arrived to appointment on time with no prompting.   Attestation Statements:    This was prepared with the assistance of Engineer, civil (consulting).  Occasional wrong-word or sound-a-like substitutions may have occurred due to the inherent limitations of voice recognition   Corinna Capra, DO

## 2023-11-15 ENCOUNTER — Ambulatory Visit: Payer: BC Managed Care – PPO | Admitting: Bariatrics

## 2023-11-23 DIAGNOSIS — E7800 Pure hypercholesterolemia, unspecified: Secondary | ICD-10-CM | POA: Diagnosis not present

## 2023-11-23 DIAGNOSIS — F411 Generalized anxiety disorder: Secondary | ICD-10-CM | POA: Diagnosis not present

## 2023-11-23 DIAGNOSIS — I10 Essential (primary) hypertension: Secondary | ICD-10-CM | POA: Diagnosis not present

## 2023-11-23 DIAGNOSIS — J029 Acute pharyngitis, unspecified: Secondary | ICD-10-CM | POA: Diagnosis not present

## 2023-11-23 DIAGNOSIS — J988 Other specified respiratory disorders: Secondary | ICD-10-CM | POA: Diagnosis not present

## 2023-11-28 ENCOUNTER — Encounter: Payer: Self-pay | Admitting: Bariatrics

## 2023-11-28 ENCOUNTER — Ambulatory Visit: Admitting: Bariatrics

## 2023-11-28 DIAGNOSIS — E559 Vitamin D deficiency, unspecified: Secondary | ICD-10-CM | POA: Diagnosis not present

## 2023-11-28 DIAGNOSIS — R632 Polyphagia: Secondary | ICD-10-CM | POA: Diagnosis not present

## 2023-11-28 DIAGNOSIS — Z6841 Body Mass Index (BMI) 40.0 and over, adult: Secondary | ICD-10-CM

## 2023-11-28 MED ORDER — WEGOVY 1 MG/0.5ML ~~LOC~~ SOAJ: 1.0000 mg | SUBCUTANEOUS | 0 refills | Status: DC

## 2023-11-28 MED ORDER — VITAMIN D (ERGOCALCIFEROL) 1.25 MG (50000 UNIT) PO CAPS: 50000.0000 [IU] | ORAL_CAPSULE | ORAL | 0 refills | Status: DC

## 2023-11-28 NOTE — Progress Notes (Signed)
 WEIGHT SUMMARY AND BIOMETRICS  Weight Lost Since Last Visit: 0  Weight Gained Since Last Visit: 3lb   Vitals Temp: 97.9 F (36.6 C) BP: (!) 161/100 Pulse Rate: 72 SpO2: 98 %   Anthropometric Measurements Height: 5' 5.5" (1.664 m) Weight: 276 lb (125.2 kg) BMI (Calculated): 45.21 Weight at Last Visit: 273lb Weight Lost Since Last Visit: 0 Weight Gained Since Last Visit: 3lb Starting Weight: 296lb Total Weight Loss (lbs): 20 lb (9.072 kg)   Body Composition  Body Fat %: 50.4 % Fat Mass (lbs): 139.2 lbs Muscle Mass (lbs): 130 lbs Total Body Water (lbs): 94.6 lbs Visceral Fat Rating : 17   Other Clinical Data Fasting: no Labs: no Today's Visit #: 7 Starting Date: 04/24/23    OBESITY Sydney Hamilton is here to discuss her progress with her obesity treatment plan along with follow-up of her obesity related diagnoses.    Nutrition Plan: the Category 2 plan - 25% adherence.  Current exercise: none  Interim History:  She is up 3 lbs since her last visit. She states that her hip bursitis flared and she has not been able to exercise.  Protein intake is as prescribed, Is exceeding snack calorie allotment, Water intake is adequate., and Reports excessive cravings.   Pharmacotherapy: Payge is on Wegovy 0.50 mg SQ weekly Adverse side effects: Constipation Using the stool softener, and using the overnight laxative occasionally.  Hunger is moderately controlled. Better but not completely controlled.  Cravings are moderately controlled.  Assessment/Plan:   Vitamin D Deficiency Vitamin D is not at goal of 50.  Most recent vitamin D level was 7.1. She is on  prescription ergocalciferol 50,000 IU weekly. Lab Results  Component Value Date   VD25OH 7.1 (L) 04/24/2023    Plan: Refill prescription vitamin D 50,000 IU weekly.   Polyphagia Tahnee endorses excessive  hunger.  Medication(s): ZOXWRU  Effects of medication:  moderately controlled. Cravings are moderately controlled.   Plan: Medication(s): Increase to Wegovy 1.0 mg SQ weekly. Will continue to use Colace and will add Fleet suppositories, MiraLAX or Citrucel every day. Will increase water, protein and fiber to help assuage hunger.  Will minimize foods that have a high glucose index/load to minimize reactive hypoglycemia.  She will get back to her plan. She will decrease her carbohydrates overall She will eat healthier snacks.     Morbid Obesity: Current BMI BMI (Calculated): 45.21   Pharmacotherapy Plan Continue  Wegovy 1.0 mg SQ weekly  [name] is currently in the action stage of change. As such, her goal is to continue with weight loss efforts.  She has agreed to the Category 2 plan.  Exercise goals: All adults should avoid inactivity. Some physical activity is better than none, and adults who participate in any amount of physical activity gain some health benefits.  Behavioral modification strategies: increasing lean protein intake, decreasing simple carbohydrates ,  no meal skipping, meal planning , increase water intake, planning for success, increasing vegetables, and avoiding temptations.  [name] has agreed to follow-up with our clinic in 4 weeks.     Objective:   VITALS: Per patient if applicable, see vitals. GENERAL: Alert and in no acute distress. CARDIOPULMONARY: No increased WOB. Speaking in clear sentences.  PSYCH: Pleasant and cooperative. Speech normal rate and rhythm. Affect is appropriate. Insight and judgement are appropriate. Attention is focused, linear, and appropriate.  NEURO: Oriented as arrived to appointment on time with no prompting.   Attestation Statements:   This was prepared with the assistance of Engineer, civil (consulting).  Occasional wrong-word or sound-a-like substitutions may have occurred due to the inherent limitations of voice recognition    Corinna Capra, DO

## 2023-12-03 DIAGNOSIS — S335XXA Sprain of ligaments of lumbar spine, initial encounter: Secondary | ICD-10-CM | POA: Diagnosis not present

## 2023-12-06 DIAGNOSIS — R142 Eructation: Secondary | ICD-10-CM | POA: Diagnosis not present

## 2023-12-06 DIAGNOSIS — S39012D Strain of muscle, fascia and tendon of lower back, subsequent encounter: Secondary | ICD-10-CM | POA: Diagnosis not present

## 2023-12-27 ENCOUNTER — Ambulatory Visit: Admitting: Nurse Practitioner

## 2024-01-02 ENCOUNTER — Encounter: Payer: Self-pay | Admitting: Nurse Practitioner

## 2024-01-02 ENCOUNTER — Telehealth: Payer: Self-pay

## 2024-01-02 ENCOUNTER — Ambulatory Visit: Admitting: Nurse Practitioner

## 2024-01-02 VITALS — BP 132/88 | HR 77 | Temp 98.0°F | Ht 65.5 in | Wt 273.0 lb

## 2024-01-02 DIAGNOSIS — Z6841 Body Mass Index (BMI) 40.0 and over, adult: Secondary | ICD-10-CM

## 2024-01-02 DIAGNOSIS — E559 Vitamin D deficiency, unspecified: Secondary | ICD-10-CM

## 2024-01-02 DIAGNOSIS — E66813 Obesity, class 3: Secondary | ICD-10-CM

## 2024-01-02 MED ORDER — VITAMIN D (ERGOCALCIFEROL) 1.25 MG (50000 UNIT) PO CAPS: 50000.0000 [IU] | ORAL_CAPSULE | ORAL | 0 refills | Status: DC

## 2024-01-02 NOTE — Telephone Encounter (Signed)
 PA submitted through Cover My Meds for Wegovy . Awaiting insurance determination. Key: [id number]

## 2024-01-02 NOTE — Progress Notes (Signed)
 Office: [phone]  /  Fax: [phone]  WEIGHT SUMMARY AND BIOMETRICS  Weight Lost Since Last Visit: 3lb  Weight Gained Since Last Visit: 0lb   Vitals Temp: 98 F (36.7 C) BP: 132/88 Pulse Rate: 77 SpO2: 98 %   Anthropometric Measurements Height: 5' 5.5" (1.664 m) Weight: 273 lb (123.8 kg) BMI (Calculated): 44.72 Weight at Last Visit: 276lb Weight Lost Since Last Visit: 3lb Weight Gained Since Last Visit: 0lb Starting Weight: 296lb Total Weight Loss (lbs): 23 lb (10.4 kg)   Body Composition  Body Fat %: 45.8 % Fat Mass (lbs): 125.2 lbs Muscle Mass (lbs): 140.6 lbs Total Body Water (lbs): 94.4 lbs Visceral Fat Rating : 15   Other Clinical Data Fasting: No Labs: No Today's Visit #: 8 Starting Date: 04/24/23     HPI  Chief Complaint: OBESITY  Yezenia is here to discuss her progress with her obesity treatment plan. She is on the the Category 2 Plan and states she is following her eating plan approximately 70 % of the time. She states she is exercising 30-45 minutes 3 days per week.   Interval History:  Since last office visit she has lost 3 pounds.  She went on a cruise since her last visit. She had some back pain and was treated with muscle relaxer and steroids.  Her back is overall feeling better and she is back to exercising 3 days per week.    Pharmacotherapy for weight loss: She is currently taking Wegovy  1mg  (skipped for one week due to back pain-saw her PCP on 12/06/23 and Aliene Ip labs obtained-patient was concerned about pancreatitis-lipase negative and patient restarted taking Wegovy ). Reports side effects of constipation.  She has been taking OTC meds with relief.    She has lost 5% of her body weight since starting Wegovy  on 05/31/23   CaseId:[id number];Status:Approved;Review Type:Prior Auth;Coverage Start Date:05/01/2023;Coverage End Date:12/27/2023;    Previous pharmacotherapy for medical weight loss:  Phentermine-stopped due to  palpitations.     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  Her highest weight prior to surgery was > 300 lbs and her nadir weight after surgery was 230 lbs. She reports some restriction.  She is taking a MV daily, Vit D weekly and Vit B12 daily.    Vit D deficiency  She is taking Vit D 50,000 IU weekly.  Denies side effects.  Denies nausea, vomiting or muscle weakness.    Lab Results  Component Value Date   VD25OH 7.1 (L) 04/24/2023     PHYSICAL EXAM:  Blood pressure 132/88, pulse 77, temperature 98 F (36.7 C), height 5' 5.5" (1.664 m), weight 273 lb (123.8 kg), last menstrual period 02/21/2015, SpO2 98%. Body mass index is 44.74 kg/m.  General: She is overweight, cooperative, alert, well developed, and in no acute distress. PSYCH: Has normal mood, affect and thought process.   Extremities: No edema.  Neurologic: No gross sensory or motor deficits. No tremors or fasciculations noted.    DIAGNOSTIC DATA REVIEWED:  BMET    Component Value Date/Time   NA 134 04/24/2023 1002   NA 134 (L) 10/02/2013 1006   K 4.0 04/24/2023 1002   K 3.7 10/02/2013 1006   CL 99 04/24/2023 1002   CL 105 10/02/2013 1006   CO2 25 04/24/2023 1002   CO2 26 10/02/2013 1006   GLUCOSE 86 04/24/2023 1002   GLUCOSE 77 03/26/2017 1507   GLUCOSE 84 10/02/2013 1006   BUN  14 04/24/2023 1002   BUN 11 10/02/2013 1006   CREATININE 0.77 04/24/2023 1002   CREATININE 0.81 10/02/2013 1006   CALCIUM 9.7 04/24/2023 1002   CALCIUM 9.0 10/02/2013 1006   GFRNONAA >60 04/03/2017 1916   GFRNONAA >60 10/02/2013 1006   GFRAA >60 04/03/2017 1916   GFRAA >60 10/02/2013 1006   Lab Results  Component Value Date   HGBA1C 6.0 (H) 04/24/2023   HGBA1C 5.8 10/02/2013   Lab Results  Component Value Date   INSULIN  18.7 04/24/2023   Lab Results  Component Value Date   TSH 1.800 04/24/2023   CBC    Component Value Date/Time   WBC 8.5 04/04/2017 0527   RBC 4.31  04/04/2017 0527   HGB 11.9 (L) 04/04/2017 0527   HGB 12.7 10/02/2013 1006   HCT 35.7 (L) 04/04/2017 0527   HCT 38.4 10/02/2013 1006   PLT 196 04/04/2017 0527   PLT 219 10/02/2013 1006   MCV 82.8 04/04/2017 0527   MCV 86 10/02/2013 1006   MCH 27.6 04/04/2017 0527   MCHC 33.3 04/04/2017 0527   RDW 14.2 04/04/2017 0527   RDW 14.5 10/02/2013 1006   Iron Studies    Component Value Date/Time   IRON 62 10/02/2013 1006   TIBC 425 10/02/2013 1006   FERRITIN 7 (L) 10/02/2013 1006   IRONPCTSAT 15 10/02/2013 1006   Lipid Panel     Component Value Date/Time   CHOL 215 (H) 04/24/2023 1002   TRIG 125 04/24/2023 1002   HDL 57 04/24/2023 1002   LDLCALC 136 (H) 04/24/2023 1002   Hepatic Function Panel     Component Value Date/Time   PROT 7.1 04/24/2023 1002   PROT 8.0 10/02/2013 1006   ALBUMIN 3.9 04/24/2023 1002   ALBUMIN 3.3 (L) 10/02/2013 1006   AST 10 04/24/2023 1002   AST 20 10/02/2013 1006   ALT 13 04/24/2023 1002   ALT 23 10/02/2013 1006   ALKPHOS 77 04/24/2023 1002   ALKPHOS 77 10/02/2013 1006   BILITOT 0.6 04/24/2023 1002   BILITOT 0.4 10/02/2013 1006      Component Value Date/Time   TSH 1.800 04/24/2023 1002   Nutritional Lab Results  Component Value Date   VD25OH 7.1 (L) 04/24/2023     ASSESSMENT AND PLAN  TREATMENT PLAN FOR OBESITY:  Recommended Dietary Goals  Lulla is currently in the action stage of change. As such, her goal is to continue weight management plan. She has agreed to the Category 2 Plan.  Behavioral Intervention  We discussed the following Behavioral Modification Strategies today: increasing lean protein intake to established goals, decreasing simple carbohydrates , increasing vegetables, increasing fiber rich foods, increasing water intake , and continue to work on maintaining a reduced calorie state, getting the recommended amount of protein, incorporating whole foods, making healthy choices, staying well hydrated and practicing  mindfulness when eating..  Additional resources provided today: NA  Recommended Physical Activity Goals  Liisa has been advised to work up to 150 minutes of moderate intensity aerobic activity a week and strengthening exercises 2-3 times per week for cardiovascular health, weight loss maintenance and preservation of muscle mass.   She has agreed to Think about enjoyable ways to increase daily physical activity and overcoming barriers to exercise, Increase physical activity in their day and reduce sedentary time (increase NEAT)., Start strengthening exercises with a goal of 2-3 sessions a week , and continue to gradually increase the amount and intensity of exercise routine   Pharmacotherapy We discussed  various medication options to help Alonda with her weight loss efforts and we both agreed to continue Wegovy  1mg .  Side effects discussed.  ASSOCIATED CONDITIONS ADDRESSED TODAY  Action/Plan  Vitamin D  deficiency -     Vitamin D  (Ergocalciferol ); Take 1 capsule (50,000 Units total) by mouth every 7 (seven) days.  Dispense: 5 capsule; Refill: 0  Class 3 severe obesity due to excess calories with serious comorbidity and body mass index (BMI) of 40.0 to 44.9 in adult     Labs recommended in 1-3 months.      Return in about 4 weeks (around 01/30/2024).Aaron Aas She was informed of the importance of frequent follow up visits to maximize her success with intensive lifestyle modifications for her multiple health conditions.   ATTESTASTION STATEMENTS:  Reviewed by clinician on day of visit: allergies, medications, problem list, medical history, surgical history, family history, social history, and previous encounter notes.   Crist Dominion. Jashan Cotten FNP-C

## 2024-01-03 NOTE — Telephone Encounter (Signed)
 PA for Wegovy  has been approved. PA is now complete.     Your prior authorization for Wegovy  has been approved! More Info Personalized support and financial assistance may be available through the [employer] program. For more information, and to see program requirements, click on the More Info button to the right.  Message from plan: [id number];XBJYNW:[id number];Review Type:Prior Auth;Coverage Start Date:12/03/2023;Coverage End Date:01/01/2025;. Authorization Expiration Date: Jan 01, 2025.

## 2024-01-14 ENCOUNTER — Telehealth: Payer: Self-pay | Admitting: Nurse Practitioner

## 2024-01-14 NOTE — Telephone Encounter (Signed)
 Pt is calling because her pharmacy will not fill her RX. She doesn't know if you need to increase the [employer]. Please call pt.

## 2024-01-15 ENCOUNTER — Other Ambulatory Visit: Payer: Self-pay

## 2024-01-15 MED ORDER — WEGOVY 1 MG/0.5ML ~~LOC~~ SOAJ: 1.0000 mg | SUBCUTANEOUS | 0 refills | Status: DC

## 2024-01-31 ENCOUNTER — Ambulatory Visit: Admitting: Nurse Practitioner

## 2024-01-31 ENCOUNTER — Encounter: Payer: Self-pay | Admitting: Nurse Practitioner

## 2024-01-31 VITALS — BP 126/82 | HR 75 | Temp 97.5°F | Ht 65.5 in | Wt 267.0 lb

## 2024-01-31 DIAGNOSIS — E66813 Obesity, class 3: Secondary | ICD-10-CM | POA: Diagnosis not present

## 2024-01-31 DIAGNOSIS — E559 Vitamin D deficiency, unspecified: Secondary | ICD-10-CM

## 2024-01-31 DIAGNOSIS — Z6841 Body Mass Index (BMI) 40.0 and over, adult: Secondary | ICD-10-CM | POA: Diagnosis not present

## 2024-01-31 MED ORDER — WEGOVY 1 MG/0.5ML ~~LOC~~ SOAJ: 1.0000 mg | SUBCUTANEOUS | 0 refills | Status: DC

## 2024-01-31 MED ORDER — VITAMIN D (ERGOCALCIFEROL) 1.25 MG (50000 UNIT) PO CAPS: 50000.0000 [IU] | ORAL_CAPSULE | ORAL | 0 refills | Status: DC

## 2024-01-31 NOTE — Progress Notes (Signed)
 Office: [phone]  /  Fax: [phone]  WEIGHT SUMMARY AND BIOMETRICS  Weight Lost Since Last Visit: 6lb  Weight Gained Since Last Visit: 0   Vitals Temp: (!) 97.5 F (36.4 C) BP: 126/82 Pulse Rate: 75 SpO2: 98 %   Anthropometric Measurements Height: 5' 5.5" (1.664 m) Weight: 267 lb (121.1 kg) BMI (Calculated): 43.74 Weight at Last Visit: 273lb Weight Lost Since Last Visit: 6lb Weight Gained Since Last Visit: 0 Starting Weight: 296lb Total Weight Loss (lbs): 29 lb (13.2 kg)   Body Composition  Body Fat %: 44.6 % Fat Mass (lbs): 119.4 lbs Muscle Mass (lbs): 141 lbs Total Body Water (lbs): 93.8 lbs Visceral Fat Rating : 14   Other Clinical Data Fasting: no Labs: no Today's Visit #: 9 Starting Date: 04/24/23     HPI  Chief Complaint: OBESITY  Sydney Hamilton is here to discuss her progress with her obesity treatment plan. She is on the the Category 2 Plan and states she is following her eating plan approximately 95 % of the time. She states she is exercising 45-60 minutes 4 days per week.   Interval History:  Since last office visit she has lost 6 pounds.  She has been doing better with following the meal plan.  She is drinking a protein shake daily.  She is drinking more water. She has been walking more.  She is averaging around 6,000 steps 4 days per week.  Her goal is to average 8,000 steps per day.          Pharmacotherapy for weight loss: She is currently taking Wegovy  1mg .  Reports some side effects of constipation.  Takes MOM PRN-maybe once per week.     She has lost 20 lbs since starting Wegovy  on 05/31/23   Wegovy  approved through 01/01/25   Previous pharmacotherapy for medical weight loss:  Phentermine-stopped due to palpitations.     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  Her highest weight prior to surgery was > 300 lbs and her nadir weight after surgery was 230 lbs. She reports some  restriction.  She is taking a MV daily, Vit D weekly and Vit B12 daily.   PHYSICAL EXAM:  Blood pressure 126/82, pulse 75, temperature (!) 97.5 F (36.4 C), height 5' 5.5" (1.664 m), weight 267 lb (121.1 kg), last menstrual period 02/21/2015, SpO2 98%. Body mass index is 43.76 kg/m.  General: She is overweight, cooperative, alert, well developed, and in no acute distress. PSYCH: Has normal mood, affect and thought process.   Extremities: No edema.  Neurologic: No gross sensory or motor deficits. No tremors or fasciculations noted.    DIAGNOSTIC DATA REVIEWED:  BMET    Component Value Date/Time   NA 134 04/24/2023 1002   NA 134 (L) 10/02/2013 1006   K 4.0 04/24/2023 1002   K 3.7 10/02/2013 1006   CL 99 04/24/2023 1002   CL 105 10/02/2013 1006   CO2 25 04/24/2023 1002   CO2 26 10/02/2013 1006   GLUCOSE 86 04/24/2023 1002   GLUCOSE 77 03/26/2017 1507   GLUCOSE 84 10/02/2013 1006   BUN 14 04/24/2023 1002   BUN 11 10/02/2013 1006   CREATININE 0.77 04/24/2023 1002   CREATININE 0.81 10/02/2013 1006   CALCIUM 9.7 04/24/2023 1002   CALCIUM 9.0 10/02/2013 1006   GFRNONAA >60 04/03/2017 1916   GFRNONAA >60 10/02/2013 1006   GFRAA >60 04/03/2017 1916   GFRAA >60 10/02/2013 1006  Lab Results  Component Value Date   HGBA1C 6.0 (H) 04/24/2023   HGBA1C 5.8 10/02/2013   Lab Results  Component Value Date   INSULIN  18.7 04/24/2023   Lab Results  Component Value Date   TSH 1.800 04/24/2023   CBC    Component Value Date/Time   WBC 8.5 04/04/2017 0527   RBC 4.31 04/04/2017 0527   HGB 11.9 (L) 04/04/2017 0527   HGB 12.7 10/02/2013 1006   HCT 35.7 (L) 04/04/2017 0527   HCT 38.4 10/02/2013 1006   PLT 196 04/04/2017 0527   PLT 219 10/02/2013 1006   MCV 82.8 04/04/2017 0527   MCV 86 10/02/2013 1006   MCH 27.6 04/04/2017 0527   MCHC 33.3 04/04/2017 0527   RDW 14.2 04/04/2017 0527   RDW 14.5 10/02/2013 1006   Iron Studies    Component Value Date/Time   IRON 62  10/02/2013 1006   TIBC 425 10/02/2013 1006   FERRITIN 7 (L) 10/02/2013 1006   IRONPCTSAT 15 10/02/2013 1006   Lipid Panel     Component Value Date/Time   CHOL 215 (H) 04/24/2023 1002   TRIG 125 04/24/2023 1002   HDL 57 04/24/2023 1002   LDLCALC 136 (H) 04/24/2023 1002   Hepatic Function Panel     Component Value Date/Time   PROT 7.1 04/24/2023 1002   PROT 8.0 10/02/2013 1006   ALBUMIN 3.9 04/24/2023 1002   ALBUMIN 3.3 (L) 10/02/2013 1006   AST 10 04/24/2023 1002   AST 20 10/02/2013 1006   ALT 13 04/24/2023 1002   ALT 23 10/02/2013 1006   ALKPHOS 77 04/24/2023 1002   ALKPHOS 77 10/02/2013 1006   BILITOT 0.6 04/24/2023 1002   BILITOT 0.4 10/02/2013 1006      Component Value Date/Time   TSH 1.800 04/24/2023 1002   Nutritional Lab Results  Component Value Date   VD25OH 7.1 (L) 04/24/2023     ASSESSMENT AND PLAN  TREATMENT PLAN FOR OBESITY:  Recommended Dietary Goals  Joee is currently in the action stage of change. As such, her goal is to continue weight management plan. She has agreed to the Category 2 Plan.  Behavioral Intervention  We discussed the following Behavioral Modification Strategies today: increasing lean protein intake to established goals, decreasing simple carbohydrates , increasing vegetables, increasing fiber rich foods, increasing water intake , work on meal planning and preparation, and continue to work on maintaining a reduced calorie state, getting the recommended amount of protein, incorporating whole foods, making healthy choices, staying well hydrated and practicing mindfulness when eating..  Additional resources provided today: NA  Recommended Physical Activity Goals  Marchell has been advised to work up to 150 minutes of moderate intensity aerobic activity a week and strengthening exercises 2-3 times per week for cardiovascular health, weight loss maintenance and preservation of muscle mass.   She has agreed to Think about enjoyable  ways to increase daily physical activity and overcoming barriers to exercise, Increase physical activity in their day and reduce sedentary time (increase NEAT)., and continue to gradually increase the amount and intensity of exercise routine   Discussed the importance of adding in resistance training.   Pharmacotherapy We discussed various medication options to help Kaina with her weight loss efforts and we both agreed to continue Wegovy  1mg . Side effects discussed.  ASSOCIATED CONDITIONS ADDRESSED TODAY  Action/Plan  Vitamin D  deficiency -     Vitamin D  (Ergocalciferol ); Take 1 capsule (50,000 Units total) by mouth every 7 (seven) days.  Dispense:  5 capsule; Refill: 0  Class 3 severe obesity due to excess calories with serious comorbidity and body mass index (BMI) of 40.0 to 44.9 in adult -     Wegovy ; Inject 1 mg into the skin once a week.  Dispense: 2 mL; Refill: 0      Will obtain labs at next visit   Return in about 4 weeks (around 02/28/2024).Aaron Aas She was informed of the importance of frequent follow up visits to maximize her success with intensive lifestyle modifications for her multiple health conditions.   ATTESTASTION STATEMENTS:  Reviewed by clinician on day of visit: allergies, medications, problem list, medical history, surgical history, family history, social history, and previous encounter notes.   Crist Dominion. Casten Floren FNP-C

## 2024-03-06 ENCOUNTER — Ambulatory Visit: Admitting: Nurse Practitioner

## 2024-03-20 ENCOUNTER — Ambulatory Visit: Admitting: Nurse Practitioner

## 2024-03-24 ENCOUNTER — Ambulatory Visit (INDEPENDENT_AMBULATORY_CARE_PROVIDER_SITE_OTHER): Admitting: Nurse Practitioner

## 2024-03-24 ENCOUNTER — Encounter: Payer: Self-pay | Admitting: Nurse Practitioner

## 2024-03-24 VITALS — BP 131/84 | HR 84 | Temp 98.0°F | Ht 65.5 in | Wt 256.0 lb

## 2024-03-24 DIAGNOSIS — Z6841 Body Mass Index (BMI) 40.0 and over, adult: Secondary | ICD-10-CM | POA: Diagnosis not present

## 2024-03-24 DIAGNOSIS — E66813 Obesity, class 3: Secondary | ICD-10-CM

## 2024-03-24 DIAGNOSIS — E559 Vitamin D deficiency, unspecified: Secondary | ICD-10-CM

## 2024-03-24 MED ORDER — VITAMIN D (ERGOCALCIFEROL) 1.25 MG (50000 UNIT) PO CAPS: 50000.0000 [IU] | ORAL_CAPSULE | ORAL | 0 refills | Status: DC

## 2024-03-24 MED ORDER — WEGOVY 1 MG/0.5ML ~~LOC~~ SOAJ: 1.0000 mg | SUBCUTANEOUS | 0 refills | Status: DC

## 2024-03-24 NOTE — Progress Notes (Signed)
 Office: [phone]  /  Fax: [phone]  WEIGHT SUMMARY AND BIOMETRICS  Weight Lost Since Last Visit: 11lb  Weight Gained Since Last Visit: 0lb   Vitals Temp: 98 F (36.7 C) BP: 131/84 Pulse Rate: 84 SpO2: 99 %   Anthropometric Measurements Height: 5' 5.5 (1.664 m) Weight: 256 lb (116.1 kg) BMI (Calculated): 41.94 Weight at Last Visit: 267lb Weight Lost Since Last Visit: 11lb Weight Gained Since Last Visit: 0lb Starting Weight: 296lb Total Weight Loss (lbs): 40 lb (18.1 kg)   Body Composition  Body Fat %: 46.2 % Fat Mass (lbs): 118.4 lbs Muscle Mass (lbs): 131 lbs Total Body Water (lbs): 94.8 lbs Visceral Fat Rating : 14   Other Clinical Data Fasting: No Labs: Yes Today's Visit #: 10 Starting Date: 04/24/23     HPI  Chief Complaint: OBESITY  Sydney Hamilton is here to discuss her progress with her obesity treatment plan. She is on the the Category 2 Plan and states she is following her eating plan approximately 80 % of the time. She states she is exercising 90 minutes 6 days per week.   Interval History:  Since last office visit she has lost 11 pounds. She is not skipping meals.  She is aiming to eat more protein. She is drinking water and a protein shake daily.  She is walking 6 days per week-5,000-11,000 steps per day.   She is going to the gym and doing resistance training 1 day per week.    Pharmacotherapy for weight loss: She is currently taking Wegovy  1mg .  Reports some side effects of constipation.  Takes MOM PRN-maybe once per week.     She has lost 31 lbs since starting Wegovy  on 05/31/23   Wegovy  approved through 01/01/25   Previous pharmacotherapy for medical weight loss:  Phentermine-stopped due to palpitations.     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  Her highest weight prior to surgery was > 300 lbs and her nadir weight after surgery was 230 lbs. She reports some restriction.  She is  taking a MV daily, Vit D weekly and Vit B12 daily.   Vit D deficiency  She is taking Vit D 50,000 IU weekly.  Denies side effects.  Denies nausea, vomiting or muscle weakness.    Lab Results  Component Value Date   VD25OH 7.1 (L) 04/24/2023       PHYSICAL EXAM:  Blood pressure 131/84, pulse 84, temperature 98 F (36.7 C), height 5' 5.5 (1.664 m), weight 256 lb (116.1 kg), last menstrual period 02/21/2015, SpO2 99%. Body mass index is 41.95 kg/m.  General: She is overweight, cooperative, alert, well developed, and in no acute distress. PSYCH: Has normal mood, affect and thought process.   Extremities: No edema.  Neurologic: No gross sensory or motor deficits. No tremors or fasciculations noted.    DIAGNOSTIC DATA REVIEWED:  BMET    Component Value Date/Time   NA 134 04/24/2023 1002   NA 134 (L) 10/02/2013 1006   K 4.0 04/24/2023 1002   K 3.7 10/02/2013 1006   CL 99 04/24/2023 1002   CL 105 10/02/2013 1006   CO2 25 04/24/2023 1002   CO2 26 10/02/2013 1006   GLUCOSE 86 04/24/2023 1002   GLUCOSE 77 03/26/2017 1507   GLUCOSE 84 10/02/2013 1006   BUN 14 04/24/2023 1002   BUN 11 10/02/2013 1006   CREATININE 0.77 04/24/2023 1002   CREATININE 0.81 10/02/2013 1006  CALCIUM 9.7 04/24/2023 1002   CALCIUM 9.0 10/02/2013 1006   GFRNONAA >60 04/03/2017 1916   GFRNONAA >60 10/02/2013 1006   GFRAA >60 04/03/2017 1916   GFRAA >60 10/02/2013 1006   Lab Results  Component Value Date   HGBA1C 6.0 (H) 04/24/2023   HGBA1C 5.8 10/02/2013   Lab Results  Component Value Date   INSULIN  18.7 04/24/2023   Lab Results  Component Value Date   TSH 1.800 04/24/2023   CBC    Component Value Date/Time   WBC 8.5 04/04/2017 0527   RBC 4.31 04/04/2017 0527   HGB 11.9 (L) 04/04/2017 0527   HGB 12.7 10/02/2013 1006   HCT 35.7 (L) 04/04/2017 0527   HCT 38.4 10/02/2013 1006   PLT 196 04/04/2017 0527   PLT 219 10/02/2013 1006   MCV 82.8 04/04/2017 0527   MCV 86 10/02/2013 1006    MCH 27.6 04/04/2017 0527   MCHC 33.3 04/04/2017 0527   RDW 14.2 04/04/2017 0527   RDW 14.5 10/02/2013 1006   Iron Studies    Component Value Date/Time   IRON 62 10/02/2013 1006   TIBC 425 10/02/2013 1006   FERRITIN 7 (L) 10/02/2013 1006   IRONPCTSAT 15 10/02/2013 1006   Lipid Panel     Component Value Date/Time   CHOL 215 (H) 04/24/2023 1002   TRIG 125 04/24/2023 1002   HDL 57 04/24/2023 1002   LDLCALC 136 (H) 04/24/2023 1002   Hepatic Function Panel     Component Value Date/Time   PROT 7.1 04/24/2023 1002   PROT 8.0 10/02/2013 1006   ALBUMIN 3.9 04/24/2023 1002   ALBUMIN 3.3 (L) 10/02/2013 1006   AST 10 04/24/2023 1002   AST 20 10/02/2013 1006   ALT 13 04/24/2023 1002   ALT 23 10/02/2013 1006   ALKPHOS 77 04/24/2023 1002   ALKPHOS 77 10/02/2013 1006   BILITOT 0.6 04/24/2023 1002   BILITOT 0.4 10/02/2013 1006      Component Value Date/Time   TSH 1.800 04/24/2023 1002   Nutritional Lab Results  Component Value Date   VD25OH 7.1 (L) 04/24/2023     ASSESSMENT AND PLAN  TREATMENT PLAN FOR OBESITY:  Recommended Dietary Goals  Sydney Hamilton is currently in the action stage of change. As such, her goal is to continue weight management plan. She has agreed to the Category 2 Plan.  Behavioral Intervention  We discussed the following Behavioral Modification Strategies today: increasing lean protein intake to established goals, decreasing simple carbohydrates , increasing vegetables, increasing fiber rich foods, increasing water intake , work on meal planning and preparation, reading food labels , keeping healthy foods at home, and continue to work on maintaining a reduced calorie state, getting the recommended amount of protein, incorporating whole foods, making healthy choices, staying well hydrated and practicing mindfulness when eating..  Additional resources provided today: NA  Recommended Physical Activity Goals  Sydney Hamilton has been advised to work up to 150 minutes  of moderate intensity aerobic activity a week and strengthening exercises 2-3 times per week for cardiovascular health, weight loss maintenance and preservation of muscle mass.   She has agreed to Think about enjoyable ways to increase daily physical activity and overcoming barriers to exercise, Increase physical activity in their day and reduce sedentary time (increase NEAT)., and continue to gradually increase the amount and intensity of exercise routine   Pharmacotherapy We discussed various medication options to help [facility] with her weight loss efforts and we both agreed to continue Wegovy  1mg . Side  effects discussed.  ASSOCIATED CONDITIONS ADDRESSED TODAY  Action/Plan  Vitamin D  deficiency -     Vitamin D  (Ergocalciferol ); Take 1 capsule (50,000 Units total) by mouth every 7 (seven) days.  Dispense: 5 capsule; Refill: 0  Class 3 severe obesity due to excess calories with serious comorbidity and body mass index (BMI) of 40.0 to 44.9 in adult -     Wegovy ; Inject 1 mg into the skin once a week.  Dispense: 2 mL; Refill: 0      She is planning to have labs obtained tomorrow at work Will obtain labs needed at next visit.   Return in about 4 weeks (around 04/21/2024).SABRA She was informed of the importance of frequent follow up visits to maximize her success with intensive lifestyle modifications for her multiple health conditions.   ATTESTASTION STATEMENTS:  Reviewed by clinician on day of visit: allergies, medications, problem list, medical history, surgical history, family history, social history, and previous encounter notes.     Corean SAUNDERS. Daly Whipkey FNP-C

## 2024-04-17 ENCOUNTER — Ambulatory Visit: Admitting: Nurse Practitioner

## 2024-05-01 ENCOUNTER — Ambulatory Visit: Admitting: Nurse Practitioner

## 2024-05-01 ENCOUNTER — Encounter: Payer: Self-pay | Admitting: Nurse Practitioner

## 2024-05-01 VITALS — BP 140/82 | HR 68 | Temp 97.6°F | Ht 65.5 in | Wt 262.0 lb

## 2024-05-01 DIAGNOSIS — I10 Essential (primary) hypertension: Secondary | ICD-10-CM

## 2024-05-01 DIAGNOSIS — E66813 Obesity, class 3: Secondary | ICD-10-CM

## 2024-05-01 DIAGNOSIS — Z6841 Body Mass Index (BMI) 40.0 and over, adult: Secondary | ICD-10-CM | POA: Diagnosis not present

## 2024-05-01 MED ORDER — WEGOVY 1.7 MG/0.75ML ~~LOC~~ SOAJ: 1.7000 mg | SUBCUTANEOUS | 0 refills | Status: DC
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
## 2024-05-08 ENCOUNTER — Telehealth: Payer: Self-pay | Admitting: Nurse Practitioner

## 2024-05-08 DIAGNOSIS — E559 Vitamin D deficiency, unspecified: Secondary | ICD-10-CM

## 2024-05-08 NOTE — Telephone Encounter (Signed)
 Pt is calling requesting a refill on vitamin D 

## 2024-05-12 MED ORDER — VITAMIN D (ERGOCALCIFEROL) 1.25 MG (50000 UNIT) PO CAPS: 50000.0000 [IU] | ORAL_CAPSULE | ORAL | 0 refills | Status: DC

## 2024-05-12 NOTE — Telephone Encounter (Signed)
 Medication sent in.

## 2024-05-29 ENCOUNTER — Ambulatory Visit: Admitting: Nurse Practitioner

## 2024-06-03 ENCOUNTER — Ambulatory Visit: Admitting: Nurse Practitioner

## 2024-06-03 ENCOUNTER — Encounter: Payer: Self-pay | Admitting: Nurse Practitioner

## 2024-06-03 VITALS — BP 125/83 | HR 70 | Temp 97.9°F | Ht 65.5 in | Wt 251.0 lb

## 2024-06-03 DIAGNOSIS — R7303 Prediabetes: Secondary | ICD-10-CM | POA: Diagnosis not present

## 2024-06-03 DIAGNOSIS — I10 Essential (primary) hypertension: Secondary | ICD-10-CM

## 2024-06-03 DIAGNOSIS — E559 Vitamin D deficiency, unspecified: Secondary | ICD-10-CM

## 2024-06-03 DIAGNOSIS — R7989 Other specified abnormal findings of blood chemistry: Secondary | ICD-10-CM

## 2024-06-03 DIAGNOSIS — K912 Postsurgical malabsorption, not elsewhere classified: Secondary | ICD-10-CM

## 2024-06-03 DIAGNOSIS — Z6841 Body Mass Index (BMI) 40.0 and over, adult: Secondary | ICD-10-CM

## 2024-06-03 DIAGNOSIS — E66813 Obesity, class 3: Secondary | ICD-10-CM

## 2024-06-03 MED ORDER — WEGOVY 1.7 MG/0.75ML ~~LOC~~ SOAJ: 1.7000 mg | SUBCUTANEOUS | 0 refills | Status: DC
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
## 2024-06-10 LAB — CBC WITH DIFFERENTIAL/PLATELET
Basophils Absolute: 0 x10E3/uL (ref 0.0–0.2)
Basos: 1 %
EOS (ABSOLUTE): 0.1 x10E3/uL (ref 0.0–0.4)
Eos: 2 %
Hematocrit: 41 % (ref 34.0–46.6)
Hemoglobin: 13.2 g/dL (ref 11.1–15.9)
Immature Grans (Abs): 0 x10E3/uL (ref 0.0–0.1)
Immature Granulocytes: 0 %
Lymphocytes Absolute: 2.1 x10E3/uL (ref 0.7–3.1)
Lymphs: 55 %
MCH: 29 pg (ref 26.6–33.0)
MCHC: 32.2 g/dL (ref 31.5–35.7)
MCV: 90 fL (ref 79–97)
Monocytes Absolute: 0.3 x10E3/uL (ref 0.1–0.9)
Monocytes: 8 %
Neutrophils Absolute: 1.3 x10E3/uL — ABNORMAL LOW (ref 1.4–7.0)
Neutrophils: 34 %
Platelets: 226 x10E3/uL (ref 150–450)
RBC: 4.55 x10E6/uL (ref 3.77–5.28)
RDW: 13.2 % (ref 11.7–15.4)
WBC: 3.9 x10E3/uL (ref 3.4–10.8)

## 2024-06-10 LAB — LIPID PANEL WITH LDL/HDL RATIO
Cholesterol, Total: 222 mg/dL — ABNORMAL HIGH (ref 100–199)
HDL: 60 mg/dL (ref >39)
LDL Chol Calc (NIH): 151 mg/dL — ABNORMAL HIGH (ref 0–99)
LDL/HDL Ratio: 2.5 ratio (ref 0.0–3.2)
Triglycerides: 65 mg/dL (ref 0–149)
VLDL Cholesterol Cal: 11 mg/dL (ref 5–40)

## 2024-06-10 LAB — COMPREHENSIVE METABOLIC PANEL WITH GFR
ALT: 14 IU/L (ref 0–32)
AST: 16 IU/L (ref 0–40)
Albumin: 4.2 g/dL (ref 3.9–4.9)
Alkaline Phosphatase: 77 IU/L (ref 41–116)
BUN/Creatinine Ratio: 18 (ref 9–23)
BUN: 14 mg/dL (ref 6–24)
Bilirubin Total: 0.9 mg/dL (ref 0.0–1.2)
CO2: 24 mmol/L (ref 20–29)
Calcium: 9.8 mg/dL (ref 8.7–10.2)
Chloride: 99 mmol/L (ref 96–106)
Creatinine, Ser: 0.77 mg/dL (ref 0.57–1.00)
Globulin, Total: 3.5 g/dL (ref 1.5–4.5)
Glucose: 82 mg/dL (ref 70–99)
Potassium: 4 mmol/L (ref 3.5–5.2)
Sodium: 137 mmol/L (ref 134–144)
Total Protein: 7.7 g/dL (ref 6.0–8.5)
eGFR: 94 mL/min/1.73 (ref >59)

## 2024-06-10 LAB — HEMOGLOBIN A1C
Est. average glucose Bld gHb Est-mCnc: 111 mg/dL
Hgb A1c MFr Bld: 5.5 % (ref 4.8–5.6)

## 2024-06-10 LAB — TSH: TSH: 1.18 u[IU]/mL (ref 0.450–4.500)

## 2024-06-10 LAB — FOLATE: Folate: 9.3 ng/mL (ref >3.0)

## 2024-06-10 LAB — PREALBUMIN: PREALBUMIN: 20 mg/dL (ref 12–34)

## 2024-06-10 LAB — VITAMIN B1: Thiamine: 55.7 nmol/L — ABNORMAL LOW (ref 66.5–200.0)

## 2024-06-10 LAB — VITAMIN B12: Vitamin B-12: >2000 pg/mL — ABNORMAL HIGH (ref 232–1245)

## 2024-06-10 LAB — FERRITIN: Ferritin: 128 ng/mL (ref 15–150)

## 2024-06-10 LAB — VITAMIN D 25 HYDROXY (VIT D DEFICIENCY, FRACTURES): Vit D, 25-Hydroxy: 40.7 ng/mL (ref 30.0–100.0)

## 2024-06-17 DIAGNOSIS — Z1231 Encounter for screening mammogram for malignant neoplasm of breast: Secondary | ICD-10-CM | POA: Diagnosis not present

## 2024-06-17 DIAGNOSIS — R92323 Mammographic fibroglandular density, bilateral breasts: Secondary | ICD-10-CM | POA: Diagnosis not present

## 2024-06-18 ENCOUNTER — Ambulatory Visit: Payer: Self-pay | Admitting: Nurse Practitioner

## 2024-07-03 DIAGNOSIS — H524 Presbyopia: Secondary | ICD-10-CM | POA: Diagnosis not present

## 2024-07-03 DIAGNOSIS — H43393 Other vitreous opacities, bilateral: Secondary | ICD-10-CM | POA: Diagnosis not present

## 2024-07-03 DIAGNOSIS — H52203 Unspecified astigmatism, bilateral: Secondary | ICD-10-CM | POA: Diagnosis not present

## 2024-07-07 ENCOUNTER — Telehealth: Payer: Self-pay | Admitting: Nurse Practitioner

## 2024-07-07 ENCOUNTER — Other Ambulatory Visit: Payer: Self-pay | Admitting: Nurse Practitioner

## 2024-07-07 DIAGNOSIS — Z6841 Body Mass Index (BMI) 40.0 and over, adult: Secondary | ICD-10-CM

## 2024-07-07 MED ORDER — WEGOVY 1.7 MG/0.75ML ~~LOC~~ SOAJ: 1.7000 mg | SUBCUTANEOUS | 0 refills | Status: DC

## 2024-07-07 NOTE — Telephone Encounter (Signed)
 Pt reschedule her appt for 08/04/24  She is requesting a refill on wegovy .

## 2024-07-08 ENCOUNTER — Ambulatory Visit: Admitting: Nurse Practitioner

## 2024-07-10 DIAGNOSIS — A6004 Herpesviral vulvovaginitis: Secondary | ICD-10-CM | POA: Diagnosis not present

## 2024-07-10 DIAGNOSIS — N951 Menopausal and female climacteric states: Secondary | ICD-10-CM | POA: Diagnosis not present

## 2024-07-10 DIAGNOSIS — Z01419 Encounter for gynecological examination (general) (routine) without abnormal findings: Secondary | ICD-10-CM | POA: Diagnosis not present

## 2024-08-04 ENCOUNTER — Ambulatory Visit (INDEPENDENT_AMBULATORY_CARE_PROVIDER_SITE_OTHER): Admitting: Nurse Practitioner

## 2024-08-04 ENCOUNTER — Encounter: Payer: Self-pay | Admitting: Nurse Practitioner

## 2024-08-04 VITALS — BP 137/84 | HR 70 | Temp 97.6°F | Ht 65.5 in | Wt 256.0 lb

## 2024-08-04 DIAGNOSIS — Z6841 Body Mass Index (BMI) 40.0 and over, adult: Secondary | ICD-10-CM

## 2024-08-04 DIAGNOSIS — R7989 Other specified abnormal findings of blood chemistry: Secondary | ICD-10-CM | POA: Diagnosis not present

## 2024-08-04 DIAGNOSIS — E66813 Obesity, class 3: Secondary | ICD-10-CM | POA: Diagnosis not present

## 2024-08-04 DIAGNOSIS — E785 Hyperlipidemia, unspecified: Secondary | ICD-10-CM | POA: Diagnosis not present

## 2024-08-04 DIAGNOSIS — E559 Vitamin D deficiency, unspecified: Secondary | ICD-10-CM | POA: Diagnosis not present

## 2024-08-04 MED ORDER — WEGOVY 1.7 MG/0.75ML ~~LOC~~ SOAJ: 1.7000 mg | SUBCUTANEOUS | 0 refills | Status: AC

## 2024-08-04 MED ORDER — VITAMIN D (ERGOCALCIFEROL) 1.25 MG (50000 UNIT) PO CAPS: 50000.0000 [IU] | ORAL_CAPSULE | ORAL | 0 refills | Status: AC

## 2024-08-04 NOTE — Progress Notes (Signed)
 Office: [phone]  /  Fax: [phone]  WEIGHT SUMMARY AND BIOMETRICS  Weight Lost Since Last Visit: 0lb  Weight Gained Since Last Visit: 5lb   Vitals Temp: 97.6 F (36.4 C) BP: 137/84 Pulse Rate: 70 SpO2: 100 %   Anthropometric Measurements Height: 5' 5.5 (1.664 m) Weight: 256 lb (116.1 kg) BMI (Calculated): 41.94 Weight at Last Visit: 251lb Weight Lost Since Last Visit: 0lb Weight Gained Since Last Visit: 5lb Starting Weight: 296lb Total Weight Loss (lbs): 40 lb (18.1 kg)   Body Composition  Body Fat %: 43.5 % Fat Mass (lbs): 111.4 lbs Muscle Mass (lbs): 137.6 lbs Total Body Water (lbs): 94.2 lbs Visceral Fat Rating : 13   Other Clinical Data Fasting: No Labs: No Today's Visit #: 13 Starting Date: 04/24/23     HPI  Chief Complaint: OBESITY  Lamanda is here to discuss her progress with her obesity treatment plan. She is on the the Category 2 Plan and states she is following her eating plan approximately 30 % of the time. She states she is exercising 45 minutes 3 days per week.   Interval History:  Since last office visit she has gained 5 pounds.  She notes she got off track over the thanksgiving holiday and missed her last appointment.  She notes she doesn't normally eat sweets like she did over the holidays.   She is walking but not as much as she has in the past.  She is struggling with knee pain.  She has not see ortho back for follow up.  She has had injections in the past.  She has been putting ice on it.  Her husband is currently on a transplant list and she's afraid of proceeding with any treatments just in case he gets a call for a transplant.  She is drinking water (not enough), sugar free minute maid.  She is walking to stay active.    Pharmacotherapy for weight loss: She is currently taking Wegovy  1.7 mg.  Reports some side effects of constipation but has improved.  Takes MOM PRN   She has lost 31 lbs since starting Wegovy  on 05/31/23    Wegovy  approved through 01/01/25   Previous pharmacotherapy for medical weight loss:  Phentermine-stopped due to palpitations.     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  Her highest weight prior to surgery was > 300 lbs and her nadir weight after surgery was 230 lbs. She reports some restriction.  She is taking a MV daily, Vit D weekly and Vit B12 daily.   Hyperlipidemia Medication(s): none.   Lab Results  Component Value Date   CHOL 222 (H) 06/03/2024   HDL 60 06/03/2024   LDLCALC 151 (H) 06/03/2024   TRIG 65 06/03/2024   Lab Results  Component Value Date   ALT 14 06/03/2024   AST 16 06/03/2024   ALKPHOS 77 06/03/2024   BILITOT 0.9 06/03/2024   The 10-year ASCVD risk score (Arnett DK, et al., 2019) is: 4.4%   Values used to calculate the score:     Age: 48 years     Clincally relevant sex: Female     Is Non-Hispanic African American: Yes     Diabetic: No     Tobacco smoker: No     Systolic Blood Pressure: 137 mmHg     Is BP treated: Yes     HDL Cholesterol: 60 mg/dL     Total Cholesterol: 222 mg/dL  Vit D deficiency  She is taking Vit D 50,000 IU weekly.  Denies side effects.  Denies nausea, vomiting or muscle weakness.    Lab Results  Component Value Date   VD25OH 40.7 06/03/2024   VD25OH 7.1 (L) 04/24/2023    Vit B12 def Last Vit B12 was > 2000.    PHYSICAL EXAM:  Blood pressure 137/84, pulse 70, temperature 97.6 F (36.4 C), height 5' 5.5 (1.664 m), weight 256 lb (116.1 kg), last menstrual period 02/21/2015, SpO2 100%. Body mass index is 41.95 kg/m.  General: She is overweight, cooperative, alert, well developed, and in no acute distress. PSYCH: Has normal mood, affect and thought process.   Extremities: No edema.  Neurologic: No gross sensory or motor deficits. No tremors or fasciculations noted.    DIAGNOSTIC DATA REVIEWED:  BMET    Component Value Date/Time   NA 137 06/03/2024 1230   NA 134  (L) 10/02/2013 1006   K 4.0 06/03/2024 1230   K 3.7 10/02/2013 1006   CL 99 06/03/2024 1230   CL 105 10/02/2013 1006   CO2 24 06/03/2024 1230   CO2 26 10/02/2013 1006   GLUCOSE 82 06/03/2024 1230   GLUCOSE 77 03/26/2017 1507   GLUCOSE 84 10/02/2013 1006   BUN 14 06/03/2024 1230   BUN 11 10/02/2013 1006   CREATININE 0.77 06/03/2024 1230   CREATININE 0.81 10/02/2013 1006   CALCIUM 9.8 06/03/2024 1230   CALCIUM 9.0 10/02/2013 1006   GFRNONAA >60 04/03/2017 1916   GFRNONAA >60 10/02/2013 1006   GFRAA >60 04/03/2017 1916   GFRAA >60 10/02/2013 1006   Lab Results  Component Value Date   HGBA1C 5.5 06/03/2024   HGBA1C 5.8 10/02/2013   Lab Results  Component Value Date   INSULIN  18.7 04/24/2023   Lab Results  Component Value Date   TSH 1.180 06/03/2024   CBC    Component Value Date/Time   WBC 3.9 06/03/2024 1230   WBC 8.5 04/04/2017 0527   RBC 4.55 06/03/2024 1230   RBC 4.31 04/04/2017 0527   HGB 13.2 06/03/2024 1230   HCT 41.0 06/03/2024 1230   PLT 226 06/03/2024 1230   MCV 90 06/03/2024 1230   MCV 86 10/02/2013 1006   MCH 29.0 06/03/2024 1230   MCH 27.6 04/04/2017 0527   MCHC 32.2 06/03/2024 1230   MCHC 33.3 04/04/2017 0527   RDW 13.2 06/03/2024 1230   RDW 14.5 10/02/2013 1006   Iron Studies    Component Value Date/Time   IRON 62 10/02/2013 1006   TIBC 425 10/02/2013 1006   FERRITIN 128 06/03/2024 1230   FERRITIN 7 (L) 10/02/2013 1006   IRONPCTSAT 15 10/02/2013 1006   Lipid Panel     Component Value Date/Time   CHOL 222 (H) 06/03/2024 1230   TRIG 65 06/03/2024 1230   HDL 60 06/03/2024 1230   LDLCALC 151 (H) 06/03/2024 1230   Hepatic Function Panel     Component Value Date/Time   PROT 7.7 06/03/2024 1230   PROT 8.0 10/02/2013 1006   ALBUMIN 4.2 06/03/2024 1230   ALBUMIN 3.3 (L) 10/02/2013 1006   AST 16 06/03/2024 1230   AST 20 10/02/2013 1006   ALT 14 06/03/2024 1230   ALT 23 10/02/2013 1006   ALKPHOS 77 06/03/2024 1230   ALKPHOS 77  10/02/2013 1006   BILITOT 0.9 06/03/2024 1230   BILITOT 0.4 10/02/2013 1006      Component Value Date/Time   TSH 1.180 06/03/2024 1230   Nutritional Lab  Results  Component Value Date   VD25OH 40.7 06/03/2024   VD25OH 7.1 (L) 04/24/2023     ASSESSMENT AND PLAN  TREATMENT PLAN FOR OBESITY:  Recommended Dietary Goals  Sydney Hamilton is currently in the action stage of change. As such, her goal is to continue weight management plan. She has agreed to the Category 2 Plan.  Behavioral Intervention  We discussed the following Behavioral Modification Strategies today: increasing lean protein intake to established goals, decreasing simple carbohydrates , increasing vegetables, increasing fiber rich foods, increasing water intake , work on meal planning and preparation, reading food labels , keeping healthy foods at home, celebration eating strategies, continue to work on maintaining a reduced calorie state, getting the recommended amount of protein, incorporating whole foods, making healthy choices, staying well hydrated and practicing mindfulness when eating., and increase protein intake, fibrous foods (25 grams per day for women, 30 grams for men) and water to improve satiety and decrease hunger signals. .  Additional resources provided today: NA  Recommended Physical Activity Goals  Janari has been advised to work up to 150 minutes of moderate intensity aerobic activity a week and strengthening exercises 2-3 times per week for cardiovascular health, weight loss maintenance and preservation of muscle mass.   She has agreed to Think about enjoyable ways to increase daily physical activity and overcoming barriers to exercise, Increase physical activity in their day and reduce sedentary time (increase NEAT)., Work on scheduling and tracking physical activity. , Continue to gradually increase the amount and intensity of exercise routine, Increase volume of physical activity to a goal of 240 minutes a  week, and Combine aerobic and strengthening exercises for efficiency and improved cardiometabolic health.   Pharmacotherapy We discussed various medication options to help Kami with her weight loss efforts and we both agreed to continue Wegovyt 1.7mg .  side effects discussed.  We discussed increasing the dose today.  She feels her weight gain was due to getting off track for the holidays and would like to stay on her current dose.   ASSOCIATED CONDITIONS ADDRESSED TODAY  Action/Plan  Hyperlipidemia, unspecified hyperlipidemia type Cardiovascular risk and specific lipid/LDL goals reviewed.  We discussed several lifestyle modifications today and Davene will continue to work on diet, exercise and weight loss efforts. Orders and follow up as documented in patient record.   Counseling Intensive lifestyle modifications are the first line treatment for this issue. Dietary changes: Increase soluble fiber. Decrease simple carbohydrates. Exercise changes: Moderate to vigorous-intensity aerobic activity 150 minutes per week if tolerated. Lipid-lowering medications: see documented in medical record.   To discuss treatment options with PCP  Discussed CT cardiac scoring testing.  Patient to discuss with PCP  Vitamin D  deficiency -     Vitamin D  (Ergocalciferol ); Take 1 capsule (50,000 Units total) by mouth every 7 (seven) days.  Dispense: 5 capsule; Refill: 0  Low vitamin B12 level Her last Vit B 12 level was > 2000.  To reduce Vit B12  and will recheck labs in 3 months  Class 3 severe obesity due to excess calories with serious comorbidity and body mass index (BMI) of 40.0 to 44.9 in adult [facility]) -     Wegovy ; Inject 1.7 mg into the skin once a week.  Dispense: 3 mL; Refill: 0       Labs reviewed in chart with patient from 06/03/24.  She started taking a MVI after obtaining her last labs.    Return in about 4 weeks (around 09/01/2024).SABRA She was informed  of the importance of frequent follow up  visits to maximize her success with intensive lifestyle modifications for her multiple health conditions.   ATTESTASTION STATEMENTS:  Reviewed by clinician on day of visit: allergies, medications, problem list, medical history, surgical history, family history, social history, and previous encounter notes.     Corean SAUNDERS. Eevie Lapp FNP-C

## 2024-09-16 ENCOUNTER — Ambulatory Visit: Admitting: Nurse Practitioner
